# Patient Record
Sex: Male | Born: 1970 | Race: Black or African American | Hispanic: No | Marital: Single | Smoking: Never smoker
Health system: Southern US, Community
[De-identification: ages and names within clinical notes are randomized; demographics above are authoritative.]

## PROBLEM LIST (undated history)

## (undated) DIAGNOSIS — R944 Abnormal results of kidney function studies: Secondary | ICD-10-CM

## (undated) DIAGNOSIS — I10 Essential (primary) hypertension: Secondary | ICD-10-CM

---

## 2018-09-24 DIAGNOSIS — E786 Lipoprotein deficiency: Secondary | ICD-10-CM | POA: Insufficient documentation

## 2018-09-24 DIAGNOSIS — J301 Allergic rhinitis due to pollen: Secondary | ICD-10-CM | POA: Insufficient documentation

## 2020-08-06 ENCOUNTER — Ambulatory Visit: Payer: Self-pay | Attending: Internal Medicine

## 2020-08-06 DIAGNOSIS — Z20822 Contact with and (suspected) exposure to covid-19: Secondary | ICD-10-CM

## 2020-08-07 LAB — NOVEL CORONAVIRUS, NAA: SARS-CoV-2, NAA: NOT DETECTED

## 2020-08-07 LAB — SARS-COV-2, NAA 2 DAY TAT

## 2020-08-20 ENCOUNTER — Emergency Department (HOSPITAL_COMMUNITY)
Admission: EM | Admit: 2020-08-20 | Discharge: 2020-08-20 | Disposition: A | Discharge status: Home / Self Care | Payer: 59 | Attending: Emergency Medicine | Admitting: Emergency Medicine

## 2020-08-20 ENCOUNTER — Emergency Department (HOSPITAL_COMMUNITY): Payer: 59

## 2020-08-20 ENCOUNTER — Encounter (HOSPITAL_COMMUNITY): Payer: Self-pay | Admitting: Emergency Medicine

## 2020-08-20 ENCOUNTER — Other Ambulatory Visit: Payer: Self-pay

## 2020-08-20 DIAGNOSIS — K1120 Sialoadenitis, unspecified: Secondary | ICD-10-CM | POA: Diagnosis not present

## 2020-08-20 DIAGNOSIS — R221 Localized swelling, mass and lump, neck: Secondary | ICD-10-CM | POA: Diagnosis present

## 2020-08-20 LAB — COMPREHENSIVE METABOLIC PANEL
ALT: 29 U/L (ref 0–44)
AST: 31 U/L (ref 15–41)
Albumin: 4 g/dL (ref 3.5–5.0)
Alkaline Phosphatase: 56 U/L (ref 38–126)
Anion gap: 7 (ref 5–15)
BUN: 12 mg/dL (ref 6–20)
CO2: 30 mmol/L (ref 22–32)
Calcium: 9.4 mg/dL (ref 8.9–10.3)
Chloride: 100 mmol/L (ref 98–111)
Creatinine, Ser: 1.52 mg/dL — ABNORMAL HIGH (ref 0.61–1.24)
GFR, Estimated: 56 mL/min — ABNORMAL LOW (ref >60)
Glucose, Bld: 108 mg/dL — ABNORMAL HIGH (ref 70–99)
Potassium: 4 mmol/L (ref 3.5–5.1)
Sodium: 137 mmol/L (ref 135–145)
Total Bilirubin: 0.5 mg/dL (ref 0.3–1.2)
Total Protein: 7.2 g/dL (ref 6.5–8.1)

## 2020-08-20 LAB — CBC WITH DIFFERENTIAL/PLATELET
Abs Immature Granulocytes: 0.05 10*3/uL (ref 0.00–0.07)
Basophils Absolute: 0 10*3/uL (ref 0.0–0.1)
Basophils Relative: 0 %
Eosinophils Absolute: 0.4 10*3/uL (ref 0.0–0.5)
Eosinophils Relative: 3 %
HCT: 43.7 % (ref 39.0–52.0)
Hemoglobin: 14.7 g/dL (ref 13.0–17.0)
Immature Granulocytes: 0 %
Lymphocytes Relative: 10 %
Lymphs Abs: 1.4 10*3/uL (ref 0.7–4.0)
MCH: 29.8 pg (ref 26.0–34.0)
MCHC: 33.6 g/dL (ref 30.0–36.0)
MCV: 88.6 fL (ref 80.0–100.0)
Monocytes Absolute: 0.8 10*3/uL (ref 0.1–1.0)
Monocytes Relative: 6 %
Neutro Abs: 11.2 10*3/uL — ABNORMAL HIGH (ref 1.7–7.7)
Neutrophils Relative %: 81 %
Platelets: 291 10*3/uL (ref 150–400)
RBC: 4.93 MIL/uL (ref 4.22–5.81)
RDW: 13.6 % (ref 11.5–15.5)
WBC: 13.8 10*3/uL — ABNORMAL HIGH (ref 4.0–10.5)
nRBC: 0 % (ref 0.0–0.2)

## 2020-08-20 LAB — HIV ANTIBODY (ROUTINE TESTING W REFLEX): HIV Screen 4th Generation wRfx: NONREACTIVE

## 2020-08-20 MED ORDER — CEPHALEXIN 500 MG PO CAPS
500.0000 mg | ORAL_CAPSULE | Freq: Four times a day (QID) | ORAL | 0 refills | Status: DC
Start: 2020-08-20 — End: 2020-11-27

## 2020-08-20 MED ORDER — IOHEXOL 300 MG/ML  SOLN
75.0000 mL | Freq: Once | INTRAMUSCULAR | Status: AC | PRN
  Administered 2020-08-20: 75 mL via INTRAVENOUS

## 2020-08-20 MED ORDER — CEPHALEXIN 250 MG PO CAPS
500.0000 mg | ORAL_CAPSULE | Freq: Once | ORAL | Status: AC
  Administered 2020-08-20: 500 mg via ORAL
  Filled 2020-08-20: qty 2

## 2020-08-20 NOTE — ED Notes (Signed)
All appropriate discharge materials reviewed at length with patient. Time for questions provided. Pt has no other questions at this time and verbalizes understanding of all provided materials.  

## 2020-08-20 NOTE — ED Provider Notes (Signed)
[facility] EMERGENCY DEPARTMENT Provider Note   CSN: [id number] Arrival date & time: 08/20/20  0008     History Chief Complaint  Patient presents with  . Facial Swelling    Thomas Mcgee is a 50 y.o. male.  HPI     This a 50 year old male with no reported past medical history who presents with neck swelling.  Patient reports this morning he noted slight swelling of the right side of his neck.  He had progressive worsening swelling and pain.  He states he had difficulty turning his neck to the right and opening his mouth.  He has pain with swallowing.  He took some medication at home and has had some improvement.  He denies any dental pain or recent dental procedures.  No history of similar symptoms in the past.  Denies fevers.  History reviewed. No pertinent past medical history.  There are no problems to display for this patient.   History reviewed. No pertinent surgical history.     History reviewed. No pertinent family history.     Home Medications Prior to Admission medications   Medication Sig Start Date End Date Taking? Authorizing Provider  cephALEXin (KEFLEX) 500 MG capsule Take 1 capsule (500 mg total) by mouth 4 (four) times daily. 08/20/20  Yes Rees Matura, Mayer Masker, MD    Allergies    Patient has no known allergies.  Review of Systems   Review of Systems  Constitutional: Negative for fever.  HENT: Positive for facial swelling. Negative for dental problem, drooling, trouble swallowing and voice change.   Respiratory: Negative for shortness of breath.   Cardiovascular: Negative for chest pain.  Genitourinary: Negative for dysuria.  All other systems reviewed and are negative.   Physical Exam Updated Vital Signs BP 106/84 (BP Location: Right Arm)   Pulse 71   Temp 98.8 F (37.1 C) (Oral)   Resp 15   Ht 1.676 m (5\' 6" )   Wt 79.4 kg   SpO2 94%   BMI 28.25 kg/m   Physical Exam Vitals and nursing note reviewed.   Constitutional:      Appearance: He is well-developed. He is not ill-appearing.  HENT:     Head: Normocephalic and atraumatic.     Nose: Nose normal.     Mouth/Throat:     Comments: No fullness noted of the tongue, no trismus, oropharynx clear Eyes:     Pupils: Pupils are equal, round, and reactive to light.  Neck:     Comments: Submandibular swelling to the right with tenderness to palpation, no overlying skin changes Cardiovascular:     Rate and Rhythm: Normal rate and regular rhythm.     Heart sounds: Normal heart sounds. No murmur heard.   Pulmonary:     Effort: Pulmonary effort is normal. No respiratory distress.     Breath sounds: Normal breath sounds. No wheezing.  Abdominal:     Palpations: Abdomen is soft.     Tenderness: There is no abdominal tenderness. There is no rebound.  Musculoskeletal:     Cervical back: Neck supple.     Right lower leg: No edema.     Left lower leg: No edema.  Skin:    General: Skin is warm and dry.  Neurological:     Mental Status: He is alert and oriented to person, place, and time.  Psychiatric:        Mood and Affect: Mood normal.     ED Results / Procedures / Treatments  Labs (all labs ordered are listed, but only abnormal results are displayed) Labs Reviewed  CBC WITH DIFFERENTIAL/PLATELET - Abnormal; Notable for the following components:      Result Value   WBC 13.8 (*)    Neutro Abs 11.2 (*)    All other components within normal limits  COMPREHENSIVE METABOLIC PANEL - Abnormal; Notable for the following components:   Glucose, Bld 108 (*)    Creatinine, Ser 1.52 (*)    GFR, Estimated 56 (*)    All other components within normal limits  MUMPS ANTIBODY, IGG  MUMPS ANTIBODY, IGM  HIV ANTIBODY (ROUTINE TESTING W REFLEX)    EKG None  Radiology CT Soft Tissue Neck W Contrast  Result Date: 08/20/2020 CLINICAL DATA:  50 year old male with right neck and jaw swelling and pain. EXAM: CT NECK WITH CONTRAST TECHNIQUE:  Multidetector CT imaging of the neck was performed using the standard protocol following the bolus administration of intravenous contrast. CONTRAST:  61mL OMNIPAQUE IOHEXOL 300 MG/ML  SOLN COMPARISON:  None. FINDINGS: Pharynx and larynx: Glottic and subglottic larynx are within normal limits. There is mild generalized pharyngeal mucosal space edema, and edema tracking along the right lateral wall of the pharynx which appears secondary to the right submandibular space abnormality. Inflammation in the right parapharyngeal space. Retropharyngeal and left parapharyngeal space remain within normal limits. No discrete pharyngeal or laryngeal mass. Scattered postinflammatory calcifications of the palatine tonsils. Salivary glands: Abnormally enlarged and heterogeneously enhancing right submandibular gland (coronal image 55) with inflammation in and around the right submandibular space. Thickening of the overlying platysma and small volume fluid/soft tissue edema tracking to the submental region from the right. No organized or rim enhancing fluid collection. No soft tissue gas. Ductal ectasia within the right gland, but no sialolithiasis or obstructing etiology identified. The anterior and central sublingual space remain within normal limits. Left submandibular gland and space are within normal limits. Mild left S mg ductal ectasia also. Parotid glands are within normal limits. Thyroid: Negative. Lymph nodes: Reactive appearing level 1 a and right level 1 B lymph nodes. Similar reactive appearing level 2 nodes greater on the right. No cystic or necrotic nodes. Other cervical nodal stations are within normal limits. Vascular: Major vascular structures in the neck and at the skull base including the right IJ appear patent. Dominant left and diminutive right vertebral arteries (normal variant). Limited intracranial: Negative. Visualized orbits: Negative. Mastoids and visualized paranasal sinuses: Mild to moderate bilateral  paranasal sinus mucosal thickening. All sinuses affected. No fluid levels. Tympanic cavities and mastoids are clear. Skeleton: No carious dentition identified but there is lucency about the right mandible wisdom tooth. However, no subperiosteal inflammation identified - and this is felt unrelated to the above submandibular space process. Mild degenerative endplate spurring in the cervical spine at C3-C4. No acute osseous abnormality identified. Upper chest: Negative. IMPRESSION: 1. Multi-spatial right neck edema appears secondary to Acute Sialadenitis of the Right Submandibular Gland. No obstructing lesion identified, therefore favor infectious etiology (viral, bacterial). No abscess or other complicating features. 2. Mild to moderate bilateral paranasal sinus inflammation. Electronically Signed   By: Odessa Fleming M.D.   On: 08/20/2020 05:08    Procedures Procedures   Medications Ordered in ED Medications  cephALEXin (KEFLEX) capsule 500 mg (has no administration in time range)  iohexol (OMNIPAQUE) 300 MG/ML solution 75 mL (75 mLs Intravenous Contrast Given 08/20/20 0438)    ED Course  I have reviewed the triage vital signs and the nursing notes.  Pertinent labs & imaging results that were available during my care of the patient were reviewed by me and considered in my medical decision making (see chart for details).    MDM Rules/Calculators/A&P                           Patient presents with swelling of the neck.  He is nontoxic and vital signs are reassuring.  ABCs are intact and his airway is patent.  He has swelling over the submandibular region on the right.  There are no overlying skin changes.  Consistent with sialadenitis.  Question stone although none are visible on exam.  He has not had any recent dental work and have lower suspicion for Ludwig's.  Abscess is also consideration.  Labs obtained.  He has a leukocytosis to 13 with a left shift.  He is afebrile.  CT scan shows multispecialty  right neck edema secondary to acute sialadenitis of the submandibular gland.  No stone noted.  Infectious etiology favored.  On recheck, patient denies any concerns for HIV.  No recent fevers or viral symptoms.  HIV and mumps testing were sent although mumps tends to have a propensity for the parotid gland.  Will cover for bacterial etiology with Keflex.  Patient was given ENT follow-up.  After history, exam, and medical workup I feel the patient has been appropriately medically screened and is safe for discharge home. Pertinent diagnoses were discussed with the patient. Patient was given return precautions.   Final Clinical Impression(s) / ED Diagnoses Final diagnoses:  Sialoadenitis of submandibular gland    Rx / DC Orders ED Discharge Orders         Ordered    cephALEXin (KEFLEX) 500 MG capsule  4 times daily        08/20/20 0540           Shalamar Plourde, Mayer Masker, MD 08/20/20 [phone]

## 2020-08-20 NOTE — Discharge Instructions (Addendum)
You were seen today for swelling in your neck.  Your CT scan shows evidence of inflammation and infection of your salivary gland.  This could be bacterial or viral.  HIV and mumps testing is pending.  You will be sent home with antibiotics.  You can suck on sour candy to try to milk the duct.  If you develop worsening swelling or difficulty swallowing, you should be reevaluated immediately.

## 2020-08-20 NOTE — ED Provider Notes (Signed)
  Emergency Medicine Provider in Triage Note   MSE was initiated and I personally evaluated the patient  1:12 AM on August 20, 2020 as provider in triage.   Chief Complaint: neck/jaw swelling  HPI  Patient is a 50 y.o. who presets to the ED with complaints of right neck/jaw swelling. Patient states this AM he noted some mild swelling/knot area to just below the jaw line, however tonight around 9PM this area seemed significantly worse, much more swollen & painful. Worse with attempts to open his mouth. Discomfort with swallowing. Denies trauma.    Review of Systems  Positive: Neck/jaw pain & swelling, pain with swallowing Negative: Dyspnea, vomiting  Physical Exam  BP (!) 148/108 (BP Location: Left Arm)   Pulse 89   Temp 98.8 F (37.1 C) (Oral)   Resp 17   Ht 5\' 6"  (1.676 m)   Wt 79.4 kg   SpO2 100%   BMI 28.25 kg/m    Gen:   Awake, no distress   HEENT:  Asymmetric swelling to the right anterior neck, submandibular space however no swelling/tenderness beneath the tongue. Right anterior superior neck tender to palpation. Able to open mouth 2 finger widths- limited assessment of posterior oropharynx, tolerating own secretions without difficulty.  Resp:  Normal effort, no stridor Cardiac:  Normal rate  Abd:   Nondistended, nontender  MSK:   Moves extremities without difficulty  Neuro:  Speech clear   Medical Decision Making   Initiation of care has begun. The patient has been counseled on the process, plan, and necessity for staying for the completion/evaluation, informed that the remainder of the evaluation will be completed by another provider, this initial triage assessment does not replace that evaluation, and the importance of remaining in the ED until their evaluation is complete.  CBC, CMP, CT soft tissue neck.  Given his degree of swelling and location in submandibular space with reported rapid progression nursing staff informed patient needs room.      08/20/20 08/22/20    [id number], MD 08/20/20 [phone]

## 2020-08-20 NOTE — ED Triage Notes (Signed)
Pt reports swelling that had gone from just under his jaw up to his ear.

## 2020-08-21 LAB — MUMPS ANTIBODY, IGG: Mumps IgG: 102 AU/mL (ref >10.9)

## 2020-08-22 LAB — MUMPS ANTIBODY, IGM: Mumps IgM: <0.8 AU (ref 0.00–0.79)

## 2020-11-27 ENCOUNTER — Encounter: Payer: Self-pay | Admitting: Internal Medicine

## 2020-11-27 ENCOUNTER — Other Ambulatory Visit: Payer: Self-pay

## 2020-11-27 ENCOUNTER — Ambulatory Visit (INDEPENDENT_AMBULATORY_CARE_PROVIDER_SITE_OTHER): Payer: No Typology Code available for payment source | Admitting: Internal Medicine

## 2020-11-27 DIAGNOSIS — R682 Dry mouth, unspecified: Secondary | ICD-10-CM | POA: Insufficient documentation

## 2020-11-27 DIAGNOSIS — I10 Essential (primary) hypertension: Secondary | ICD-10-CM

## 2020-11-27 DIAGNOSIS — M79672 Pain in left foot: Secondary | ICD-10-CM | POA: Diagnosis not present

## 2020-11-27 DIAGNOSIS — R739 Hyperglycemia, unspecified: Secondary | ICD-10-CM | POA: Diagnosis not present

## 2020-11-27 DIAGNOSIS — Z0000 Encounter for general adult medical examination without abnormal findings: Secondary | ICD-10-CM | POA: Insufficient documentation

## 2020-11-27 DIAGNOSIS — M79671 Pain in right foot: Secondary | ICD-10-CM

## 2020-11-27 LAB — LIPID PANEL
Cholesterol: 152 mg/dL (ref 0–200)
HDL: 31.7 mg/dL — ABNORMAL LOW (ref >39.00)
LDL Cholesterol: 102 mg/dL — ABNORMAL HIGH (ref 0–99)
NonHDL: 120.63
Total CHOL/HDL Ratio: 5
Triglycerides: 94 mg/dL (ref 0.0–149.0)
VLDL: 18.8 mg/dL (ref 0.0–40.0)

## 2020-11-27 LAB — HEPATIC FUNCTION PANEL
ALT: 13 U/L (ref 0–53)
AST: 17 U/L (ref 0–37)
Albumin: 4.4 g/dL (ref 3.5–5.2)
Alkaline Phosphatase: 55 U/L (ref 39–117)
Bilirubin, Direct: 0.1 mg/dL (ref 0.0–0.3)
Total Bilirubin: 0.7 mg/dL (ref 0.2–1.2)
Total Protein: 7.2 g/dL (ref 6.0–8.3)

## 2020-11-27 LAB — CBC WITH DIFFERENTIAL/PLATELET
Basophils Absolute: 0.1 10*3/uL (ref 0.0–0.1)
Basophils Relative: 1 % (ref 0.0–3.0)
Eosinophils Absolute: 0.3 10*3/uL (ref 0.0–0.7)
Eosinophils Relative: 6.5 % — ABNORMAL HIGH (ref 0.0–5.0)
HCT: 44 % (ref 39.0–52.0)
Hemoglobin: 14.6 g/dL (ref 13.0–17.0)
Lymphocytes Relative: 40.5 % (ref 12.0–46.0)
Lymphs Abs: 2.1 10*3/uL (ref 0.7–4.0)
MCHC: 33.2 g/dL (ref 30.0–36.0)
MCV: 87.4 fl (ref 78.0–100.0)
Monocytes Absolute: 0.4 10*3/uL (ref 0.1–1.0)
Monocytes Relative: 6.9 % (ref 3.0–12.0)
Neutro Abs: 2.3 10*3/uL (ref 1.4–7.7)
Neutrophils Relative %: 45.1 % (ref 43.0–77.0)
Platelets: 226 10*3/uL (ref 150.0–400.0)
RBC: 5.04 Mil/uL (ref 4.22–5.81)
RDW: 13.7 % (ref 11.5–15.5)
WBC: 5.1 10*3/uL (ref 4.0–10.5)

## 2020-11-27 LAB — URINALYSIS
Bilirubin Urine: NEGATIVE
Hgb urine dipstick: NEGATIVE
Ketones, ur: NEGATIVE
Leukocytes,Ua: NEGATIVE
Nitrite: NEGATIVE
Specific Gravity, Urine: 1.015 (ref 1.000–1.030)
Total Protein, Urine: NEGATIVE
Urine Glucose: NEGATIVE
Urobilinogen, UA: 0.2 (ref 0.0–1.0)
pH: 6 (ref 5.0–8.0)

## 2020-11-27 LAB — COMPREHENSIVE METABOLIC PANEL
ALT: 13 U/L (ref 0–53)
AST: 17 U/L (ref 0–37)
Albumin: 4.4 g/dL (ref 3.5–5.2)
Alkaline Phosphatase: 55 U/L (ref 39–117)
BUN: 15 mg/dL (ref 6–23)
CO2: 28 mEq/L (ref 19–32)
Calcium: 9.6 mg/dL (ref 8.4–10.5)
Chloride: 104 mEq/L (ref 96–112)
Creatinine, Ser: 1.32 mg/dL (ref 0.40–1.50)
GFR: 63.37 mL/min (ref >60.00)
Glucose, Bld: 90 mg/dL (ref 70–99)
Potassium: 4.1 mEq/L (ref 3.5–5.1)
Sodium: 140 mEq/L (ref 135–145)
Total Bilirubin: 0.7 mg/dL (ref 0.2–1.2)
Total Protein: 7.2 g/dL (ref 6.0–8.3)

## 2020-11-27 LAB — VITAMIN D 25 HYDROXY (VIT D DEFICIENCY, FRACTURES): VITD: 49.53 ng/mL (ref 30.00–100.00)

## 2020-11-27 LAB — VITAMIN B12: Vitamin B-12: 339 pg/mL (ref 211–911)

## 2020-11-27 LAB — HEMOGLOBIN A1C: Hgb A1c MFr Bld: 5.9 % (ref 4.6–6.5)

## 2020-11-27 LAB — PSA: PSA: 1.22 ng/mL (ref 0.10–4.00)

## 2020-11-27 NOTE — Progress Notes (Signed)
Subjective:  Patient ID: Thomas Mcgee, male    DOB: 08-30-1970  Age: 50 y.o. MRN: [id number]  CC: New Patient (Initial Visit)   HPI Thomas Mcgee presents for dry mouth, heel pain on the L side, complaining of urinating a lot. We need to address his HTN -he has been on Amlodipine 12 months He is from [place], 2 kids  Outpatient Medications Prior to Visit  Medication Sig Dispense Refill   amLODipine (NORVASC) 10 MG tablet Take 10 mg by mouth daily. Take 1 by mouth daily (Patient not taking: Reported on 11/27/2020)     cephALEXin (KEFLEX) 500 MG capsule Take 1 capsule (500 mg total) by mouth 4 (four) times daily. (Patient not taking: Reported on 11/27/2020) 28 capsule 0   No facility-administered medications prior to visit.    ROS: Review of Systems  Constitutional:  Positive for unexpected weight change. Negative for appetite change and fatigue.  HENT:  Negative for congestion, nosebleeds, sneezing, sore throat and trouble swallowing.   Eyes:  Negative for itching and visual disturbance.  Respiratory:  Negative for cough.   Cardiovascular:  Negative for chest pain, palpitations and leg swelling.  Gastrointestinal:  Negative for abdominal distention, blood in stool, diarrhea and nausea.  Genitourinary:  Negative for frequency and hematuria.  Musculoskeletal:  Positive for gait problem. Negative for back pain, joint swelling and neck pain.  Skin:  Negative for rash.  Neurological:  Negative for dizziness, tremors, speech difficulty and weakness.  Psychiatric/Behavioral:  Negative for agitation, dysphoric mood and sleep disturbance. The patient is not nervous/anxious.    Objective:  BP (!) 130/92 (BP Location: Left Arm)   Pulse 80   Temp 98.5 F (36.9 C) (Oral)   Ht 5' 5.5" (1.664 m)   Wt 185 lb 9.6 oz (84.2 kg)   SpO2 95%   BMI 30.42 kg/m   BP Readings from Last 3 Encounters:  11/27/20 (!) 130/92  08/20/20 106/84    Wt Readings from Last 3 Encounters:  11/27/20 185 lb  9.6 oz (84.2 kg)  08/20/20 175 lb (79.4 kg)    Physical Exam Constitutional:      General: He is not in acute distress.    Appearance: He is well-developed.     Comments: NAD  Eyes:     Conjunctiva/sclera: Conjunctivae normal.     Pupils: Pupils are equal, round, and reactive to light.  Neck:     Thyroid: No thyromegaly.     Vascular: No JVD.  Cardiovascular:     Rate and Rhythm: Normal rate and regular rhythm.     Heart sounds: Normal heart sounds. No murmur heard.   No friction rub. No gallop.  Pulmonary:     Effort: Pulmonary effort is normal. No respiratory distress.     Breath sounds: Normal breath sounds. No wheezing or rales.  Chest:     Chest wall: No tenderness.  Abdominal:     General: Bowel sounds are normal. There is no distension.     Palpations: Abdomen is soft. There is no mass.     Tenderness: There is no abdominal tenderness. There is no guarding or rebound.  Musculoskeletal:        General: No tenderness. Normal range of motion.     Cervical back: Normal range of motion.  Lymphadenopathy:     Cervical: No cervical adenopathy.  Skin:    General: Skin is warm and dry.     Findings: No rash.  Neurological:     Mental  Status: He is alert and oriented to person, place, and time.     Cranial Nerves: No cranial nerve deficit.     Motor: No abnormal muscle tone.     Coordination: Coordination normal.     Gait: Gait normal.     Deep Tendon Reflexes: Reflexes are normal and symmetric.  Psychiatric:        Behavior: Behavior normal.        Thought Content: Thought content normal.        Judgment: Judgment normal.   L foot oval growth, heel pain   Lab Results  Component Value Date   WBC 13.8 (H) 08/20/2020   HGB 14.7 08/20/2020   HCT 43.7 08/20/2020   PLT 291 08/20/2020   GLUCOSE 108 (H) 08/20/2020   ALT 29 08/20/2020   AST 31 08/20/2020   NA 137 08/20/2020   K 4.0 08/20/2020   CL 100 08/20/2020   CREATININE 1.52 (H) 08/20/2020   BUN 12 08/20/2020    CO2 30 08/20/2020    CT Soft Tissue Neck W Contrast  Result Date: 08/20/2020 CLINICAL DATA:  50 year old male with right neck and jaw swelling and pain. EXAM: CT NECK WITH CONTRAST TECHNIQUE: Multidetector CT imaging of the neck was performed using the standard protocol following the bolus administration of intravenous contrast. CONTRAST:  65mL OMNIPAQUE IOHEXOL 300 MG/ML  SOLN COMPARISON:  None. FINDINGS: Pharynx and larynx: Glottic and subglottic larynx are within normal limits. There is mild generalized pharyngeal mucosal space edema, and edema tracking along the right lateral wall of the pharynx which appears secondary to the right submandibular space abnormality. Inflammation in the right parapharyngeal space. Retropharyngeal and left parapharyngeal space remain within normal limits. No discrete pharyngeal or laryngeal mass. Scattered postinflammatory calcifications of the palatine tonsils. Salivary glands: Abnormally enlarged and heterogeneously enhancing right submandibular gland (coronal image 55) with inflammation in and around the right submandibular space. Thickening of the overlying platysma and small volume fluid/soft tissue edema tracking to the submental region from the right. No organized or rim enhancing fluid collection. No soft tissue gas. Ductal ectasia within the right gland, but no sialolithiasis or obstructing etiology identified. The anterior and central sublingual space remain within normal limits. Left submandibular gland and space are within normal limits. Mild left S mg ductal ectasia also. Parotid glands are within normal limits. Thyroid: Negative. Lymph nodes: Reactive appearing level 1 a and right level 1 B lymph nodes. Similar reactive appearing level 2 nodes greater on the right. No cystic or necrotic nodes. Other cervical nodal stations are within normal limits. Vascular: Major vascular structures in the neck and at the skull base including the right IJ appear patent.  Dominant left and diminutive right vertebral arteries (normal variant). Limited intracranial: Negative. Visualized orbits: Negative. Mastoids and visualized paranasal sinuses: Mild to moderate bilateral paranasal sinus mucosal thickening. All sinuses affected. No fluid levels. Tympanic cavities and mastoids are clear. Skeleton: No carious dentition identified but there is lucency about the right mandible wisdom tooth. However, no subperiosteal inflammation identified - and this is felt unrelated to the above submandibular space process. Mild degenerative endplate spurring in the cervical spine at C3-C4. No acute osseous abnormality identified. Upper chest: Negative. IMPRESSION: 1. Multi-spatial right neck edema appears secondary to Acute Sialadenitis of the Right Submandibular Gland. No obstructing lesion identified, therefore favor infectious etiology (viral, bacterial). No abscess or other complicating features. 2. Mild to moderate bilateral paranasal sinus inflammation. Electronically Signed   By: Althea Grimmer.D.  On: 08/20/2020 05:08    Assessment & Plan:   There are no diagnoses linked to this encounter.   No orders of the defined types were placed in this encounter.    Follow-up: No follow-ups on file.  Sonda Primes, MD

## 2020-11-27 NOTE — Patient Instructions (Addendum)
[place] for home [employer]: [employer] Plantar Fasciitis ShoesPlantar Fasciitis  Plantar fasciitis is a painful foot condition that affects the heel. It occurs when the band of tissue that connects the toes to the heel bone (plantar fascia) becomes irritated. This can happen as the result of exercising too much or doing other repetitive activities (overuse injury). Plantar fasciitis can cause mild irritation to severe pain that makes it difficult to walk or move. The pain is usually worse in the morning after sleeping, or after sitting or lying down for a period of time. Pain may also beworse after long periods of walking or standing. What are the causes? This condition may be caused by: Standing for long periods of time. Wearing shoes that do not have good arch support. Doing activities that put stress on joints (high-impact activities). This includes ballet and exercise that makes your heart beat faster (aerobic exercise), such as running. Being overweight. An abnormal way of walking (gait). Tight muscles in the back of your lower leg (calf). High arches in your feet or flat feet. Starting a new athletic activity. What are the signs or symptoms? The main symptom of this condition is heel pain. Pain may get worse after the following: Taking the first steps after a time of rest, especially in the morning after awakening, or after you have been sitting or lying down for a while. Long periods of standing still. Pain may decrease after 30-45 minutes of activity, such as gentle walking. How is this diagnosed? This condition may be diagnosed based on your medical history, a physical exam, and your symptoms. Your health care provider will check for: A tender area on the bottom of your foot. A high arch in your foot or flat feet. Pain when you move your foot. Difficulty moving your foot. You may have imaging tests to confirm the diagnosis, such as: X-rays. Ultrasound. MRI. How is this  treated? Treatment for plantar fasciitis depends on how severe your condition is. Treatment may include: Rest, ice, pressure (compression), and raising (elevating) the affected foot. This is called RICE therapy. Your health care provider may recommend RICE therapy along with over-the-counter pain medicines to manage your pain. Exercises to stretch your calves and your plantar fascia. A splint that holds your foot in a stretched, upward position while you sleep (night splint). Physical therapy to relieve symptoms and prevent problems in the future. Injections of steroid medicine (cortisone) to relieve pain and inflammation. Stimulating your plantar fascia with electrical impulses (extracorporeal shock wave therapy). This is usually the last treatment option before surgery. Surgery, if other treatments have not worked after 12 months. Follow these instructions at home: Managing pain, stiffness, and swelling  If directed, put ice on the painful area. To do this: Put ice in a plastic bag, or use a frozen bottle of water. Place a towel between your skin and the bag or bottle. Roll the bottom of your foot over the bag or bottle. Do this for 20 minutes, 2-3 times a day. Wear athletic shoes that have air-sole or gel-sole cushions, or try soft shoe inserts that are designed for plantar fasciitis. Elevate your foot above the level of your heart while you are sitting or lying down.  Activity Avoid activities that cause pain. Ask your health care provider what activities are safe for you. Do physical therapy exercises and stretches as told by your health care provider. Try activities and forms of exercise that are easier on your joints (low impact). Examples include swimming,  water aerobics, and biking. General instructions Take over-the-counter and prescription medicines only as told by your health care provider. Wear a night splint while sleeping, if told by your health care provider. Loosen the  splint if your toes tingle, become numb, or turn cold and blue. Maintain a healthy weight, or work with your health care provider to lose weight as needed. Keep all follow-up visits. This is important. Contact a health care provider if you have: Symptoms that do not go away with home treatment. Pain that gets worse. Pain that affects your ability to move or do daily activities. Summary Plantar fasciitis is a painful foot condition that affects the heel. It occurs when the band of tissue that connects the toes to the heel bone (plantar fascia) becomes irritated. Heel pain is the main symptom of this condition. It may get worse after exercising too much or standing still for a long time. Treatment varies, but it usually starts with rest, ice, pressure (compression), and raising (elevating) the affected foot. This is called RICE therapy. Over-the-counter medicines can also be used to manage pain. This information is not intended to replace advice given to you by your health care provider. Make sure you discuss any questions you have with your healthcare provider. Document Revised: 07/29/2019 Document Reviewed: 07/29/2019 Elsevier Patient Education  2022 [employer].

## 2020-11-29 DIAGNOSIS — I10 Essential (primary) hypertension: Secondary | ICD-10-CM | POA: Insufficient documentation

## 2020-11-29 NOTE — Assessment & Plan Note (Signed)
The patient has a painful left foot oval painful growth.  He has bilateral plantar fasciitis left greater than the right.  He was referred to Sports medicine for consultation.  He will use ibuprofen 600 mg to 3 times a day with meals. She will get shoes with good arch support like [employer]et

## 2020-11-29 NOTE — Assessment & Plan Note (Signed)
Currently on amlodipine 10 mg daily.  She denies feet swelling.  We will continue with current therapy.  Will obtain blood chemistry, thyroid tests, vitamin levels

## 2020-11-29 NOTE — Assessment & Plan Note (Signed)
Obtain hemoglobin A1c 

## 2020-11-29 NOTE — Assessment & Plan Note (Signed)
Unclear etiology.  She has a history of elevated glucose.  She has put 10 pounds on.  Rule out diabetes.  Obtain glucose level and hemoglobin A1c

## 2020-11-30 LAB — TSH: TSH: 0 u[IU]/mL — ABNORMAL LOW (ref 0.35–5.50)

## 2020-12-02 ENCOUNTER — Other Ambulatory Visit: Payer: Self-pay | Admitting: Internal Medicine

## 2020-12-02 DIAGNOSIS — R7989 Other specified abnormal findings of blood chemistry: Secondary | ICD-10-CM

## 2020-12-03 ENCOUNTER — Ambulatory Visit: Payer: No Typology Code available for payment source | Admitting: Allergy

## 2020-12-09 ENCOUNTER — Encounter: Payer: Self-pay | Admitting: Internal Medicine

## 2020-12-09 ENCOUNTER — Other Ambulatory Visit: Payer: Self-pay

## 2020-12-09 ENCOUNTER — Ambulatory Visit (INDEPENDENT_AMBULATORY_CARE_PROVIDER_SITE_OTHER): Payer: 59 | Admitting: Internal Medicine

## 2020-12-09 DIAGNOSIS — R7989 Other specified abnormal findings of blood chemistry: Secondary | ICD-10-CM

## 2020-12-09 DIAGNOSIS — Z7184 Encounter for health counseling related to travel: Secondary | ICD-10-CM | POA: Diagnosis not present

## 2020-12-09 DIAGNOSIS — R682 Dry mouth, unspecified: Secondary | ICD-10-CM

## 2020-12-09 DIAGNOSIS — M79671 Pain in right foot: Secondary | ICD-10-CM

## 2020-12-09 DIAGNOSIS — I10 Essential (primary) hypertension: Secondary | ICD-10-CM

## 2020-12-09 DIAGNOSIS — M79672 Pain in left foot: Secondary | ICD-10-CM

## 2020-12-09 MED ORDER — ATOVAQUONE-PROGUANIL HCL 250-100 MG PO TABS: ORAL_TABLET | ORAL | 1 refills | Status: DC

## 2020-12-09 NOTE — Progress Notes (Signed)
Subjective:  Patient ID: Thomas Mcgee, male    DOB: 1971/01/14  Age: 50 y.o. 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 Will be leaving next week) and Colonoscopy (Discuss if he need to have)   HPI Thomas Mcgee presents for abn his TSH He is traveling to [place] for 2 weeks Is pain in the feet is a little better.  Outpatient Medications Prior to Visit  Medication Sig Dispense Refill   amLODipine (NORVASC) 10 MG tablet Take 10 mg by mouth daily. Take 1 by mouth daily (Patient not taking: Reported on 11/27/2020)     No facility-administered medications prior to visit.    ROS: Review of Systems  Constitutional:  Negative for appetite change, fatigue and unexpected weight change.  HENT:  Negative for congestion, nosebleeds, sneezing, sore throat and trouble swallowing.   Eyes:  Negative for itching and visual disturbance.  Respiratory:  Negative for cough.   Cardiovascular:  Negative for chest pain, palpitations and leg swelling.  Gastrointestinal:  Negative for abdominal distention, blood in stool, diarrhea and nausea.  Genitourinary:  Negative for frequency and hematuria.  Musculoskeletal:  Negative for back pain, gait problem, joint swelling and neck pain.  Skin:  Negative for rash.  Neurological:  Negative for dizziness, tremors, speech difficulty and weakness.  Psychiatric/Behavioral:  Negative for agitation, dysphoric mood and sleep disturbance. The patient is not nervous/anxious.    Objective:  BP 132/90 (BP Location: Left Arm)   Pulse 75   Temp 98.3 F (36.8 C) (Oral)   SpO2 98%   BP Readings from Last 3 Encounters:  12/09/20 132/90  11/27/20 (!) 130/92  08/20/20 106/84    Wt Readings from Last 3 Encounters:  11/27/20 185 lb 9.6 oz (84.2 kg)  08/20/20 175 lb (79.4 kg)    Physical Exam Constitutional:      General: He is not in acute distress.    Appearance: He is well-developed.     Comments: NAD   Eyes:     Conjunctiva/sclera: Conjunctivae normal.     Pupils: Pupils are equal, round, and reactive to light.  Neck:     Thyroid: No thyromegaly.     Vascular: No JVD.  Cardiovascular:     Rate and Rhythm: Normal rate and regular rhythm.     Heart sounds: Normal heart sounds. No murmur heard.   No friction rub. No gallop.  Pulmonary:     Effort: Pulmonary effort is normal. No respiratory distress.     Breath sounds: Normal breath sounds. No wheezing or rales.  Chest:     Chest wall: No tenderness.  Abdominal:     General: Bowel sounds are normal. There is no distension.     Palpations: Abdomen is soft. There is no mass.     Tenderness: There is no abdominal tenderness. There is no guarding or rebound.  Musculoskeletal:        General: No tenderness. Normal range of motion.     Cervical back: Normal range of motion.  Lymphadenopathy:     Cervical: No cervical adenopathy.  Skin:    General: Skin is warm and dry.     Findings: No rash.  Neurological:     Mental Status: He is alert and oriented to person, place, and time.     Cranial Nerves: No cranial nerve deficit.     Motor: No abnormal muscle tone.     Coordination: Coordination normal.  Gait: Gait normal.     Deep Tendon Reflexes: Reflexes are normal and symmetric.  Psychiatric:        Behavior: Behavior normal.        Thought Content: Thought content normal.        Judgment: Judgment normal.    Lab Results  Component Value Date   WBC 5.1 11/27/2020   HGB 14.6 11/27/2020   HCT 44.0 11/27/2020   PLT 226.0 11/27/2020   GLUCOSE 90 11/27/2020   CHOL 152 11/27/2020   TRIG 94.0 11/27/2020   HDL 31.70 (L) 11/27/2020   LDLCALC 102 (H) 11/27/2020   ALT 13 11/27/2020   ALT 13 11/27/2020   AST 17 11/27/2020   AST 17 11/27/2020   NA 140 11/27/2020   K 4.1 11/27/2020   CL 104 11/27/2020   CREATININE 1.32 11/27/2020   BUN 15 11/27/2020   CO2 28 11/27/2020   TSH 0.00 Repeated and verified X2. (L) 11/27/2020   PSA  1.22 11/27/2020   HGBA1C 5.9 11/27/2020    CT Soft Tissue Neck W Contrast  Result Date: 08/20/2020 CLINICAL DATA:  50 year old male with right neck and jaw swelling and pain. EXAM: CT NECK WITH CONTRAST TECHNIQUE: Multidetector CT imaging of the neck was performed using the standard protocol following the bolus administration of intravenous contrast. CONTRAST:  19mL OMNIPAQUE IOHEXOL 300 MG/ML  SOLN COMPARISON:  None. FINDINGS: Pharynx and larynx: Glottic and subglottic larynx are within normal limits. There is mild generalized pharyngeal mucosal space edema, and edema tracking along the right lateral wall of the pharynx which appears secondary to the right submandibular space abnormality. Inflammation in the right parapharyngeal space. Retropharyngeal and left parapharyngeal space remain within normal limits. No discrete pharyngeal or laryngeal mass. Scattered postinflammatory calcifications of the palatine tonsils. Salivary glands: Abnormally enlarged and heterogeneously enhancing right submandibular gland (coronal image 55) with inflammation in and around the right submandibular space. Thickening of the overlying platysma and small volume fluid/soft tissue edema tracking to the submental region from the right. No organized or rim enhancing fluid collection. No soft tissue gas. Ductal ectasia within the right gland, but no sialolithiasis or obstructing etiology identified. The anterior and central sublingual space remain within normal limits. Left submandibular gland and space are within normal limits. Mild left S mg ductal ectasia also. Parotid glands are within normal limits. Thyroid: Negative. Lymph nodes: Reactive appearing level 1 a and right level 1 B lymph nodes. Similar reactive appearing level 2 nodes greater on the right. No cystic or necrotic nodes. Other cervical nodal stations are within normal limits. Vascular: Major vascular structures in the neck and at the skull base including the right IJ  appear patent. Dominant left and diminutive right vertebral arteries (normal variant). Limited intracranial: Negative. Visualized orbits: Negative. Mastoids and visualized paranasal sinuses: Mild to moderate bilateral paranasal sinus mucosal thickening. All sinuses affected. No fluid levels. Tympanic cavities and mastoids are clear. Skeleton: No carious dentition identified but there is lucency about the right mandible wisdom tooth. However, no subperiosteal inflammation identified - and this is felt unrelated to the above submandibular space process. Mild degenerative endplate spurring in the cervical spine at C3-C4. No acute osseous abnormality identified. Upper chest: Negative. IMPRESSION: 1. Multi-spatial right neck edema appears secondary to Acute Sialadenitis of the Right Submandibular Gland. No obstructing lesion identified, therefore favor infectious etiology (viral, bacterial). No abscess or other complicating features. 2. Mild to moderate bilateral paranasal sinus inflammation. Electronically Signed   By: Odessa Fleming  M.D.   On: 08/20/2020 05:08    Assessment & Plan:      Sonda Primes, MD

## 2020-12-09 NOTE — Assessment & Plan Note (Addendum)
Take ASA on the days of travel for DVT prophylaxis. Prescribed Malarone for malaria prophylaxis: 1 po qd - start 2 d before travel, take daily while in [place] and x 1 wk after return. Then stop

## 2020-12-09 NOTE — Progress Notes (Signed)
Subjective:    I'm seeing this patient as a consultation for:  Dr. Posey Rea. Note will be routed back to referring provider/PCP.  CC: B foot pain  I, [name], LAT, ATC, am serving as scribe for Dr. Clementeen Graham.  HPI: Pt is a 50 y/o male presenting w/ B plantar foot pain, L>R, x 4-5 months.  He locates his pain specifically to his B plantar heels and L medial arch/plantar fascia just proximal to the L 1st MTP joint.  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  He works in [employer] in [employer].  He lives most the time here in [place] and commutes to work a few days a week.  He will be returning to [place] next week and returning back today and states mid-September.  Additionally he notes a little bit of athlete's foot on his right foot and he uses Lamisil intermittently for this.  It keeps coming back.  Swelling: yes in the L plantar foot just proximal to the L 1st MTP joint Aggravating factors: walking/weight-bearing activity; jogging Treatments tried: topical pain-relieving gel  Past medical history, Surgical history, Family history, Social history, Allergies, and medications have been entered into the medical record, reviewed.   Review of Systems: No new headache, visual changes, nausea, vomiting, diarrhea, constipation, dizziness, abdominal pain, skin rash, fevers, chills, night sweats, weight loss, swollen lymph nodes, body aches, joint swelling, muscle aches, chest pain, shortness of breath, mood changes, visual or auditory hallucinations.   Objective:    Vitals:   12/10/20 0906  BP: 120/84  Pulse: 77  SpO2: 95%   General: Well Developed, well nourished, and in no acute distress.  Neuro/Psych: Alert and oriented x3, extra-ocular muscles intact, able to move all 4 extremities, sensation grossly intact. Skin: Warm and dry.  Whitish area at interdigital webspace between fourth and fifth toe right foot. Respiratory: Not using accessory  muscles, speaking in full sentences, trachea midline.  Cardiovascular: Pulses palpable, no extremity edema. Abdomen: Does not appear distended. MSK: Left foot: Palpable nodule plantar aspect left foot just proximal to first MCP.  Mildly tender palpation.   Lab and Radiology Results Diagnostic Limited MSK Ultrasound of: Left foot plantar nodule first metatarsal Solid and partially cystic nodule visible plantar foot overlying flexor houses longus tendon.  Some hypoechoic cystic structures present intermixed with more solid structures.  No severe significant increased vascular activity. Impression: Heterogeneous see me cystic and solid nodule plantar aspect left foot overlying first metatarsal unclear etiology.  X-ray images left foot obtained today personally and independently interpreted No significant bony abnormality and area of question where the nodule is located plantar aspect foot. No acute fracture.  Question sequelae remote fracture proximal third metatarsal Await formal radiology review   Impression and Recommendations:    Assessment and Plan: 50 y.o. male with left plantar foot nodule with pain.  Patient has a easily palpable superficial nodule on the left plantar foot.  This is effectively causing excessive pressure and pain.  Unfortunately I am not sure exactly what the nodule is.  I think it is probably a lipoma however the characteristics of it on ultrasound are not typical for lipoma.  Soft tissue malignancy is pretty unlikely but a remote possibility.  Plan for MRI left foot with and without contrast to further evaluate the soft tissue mass.  This will also be for potential surgical planning.  I think ultimately this will need to be removed surgically.  For now we will discussed offloading pressure.  I have created a first ray float to offload pressure at the nodule and showed him how to make this for his own shoes using foam adhesive pads.  He notes this feels better  already.  Recheck with me after the MRI is completed.  As for the athlete's foot instructed that he needs to use Lamisil more consistently at least daily or twice daily for up to 3 weeks.  If not better certainly could try oral terbinafine.  However this would require labs first..  PDMP not reviewed this encounter. Orders Placed This Encounter  Procedures   [place] LIMITED JOINT SPACE STRUCTURES LOW BILAT(NO LINKED CHARGES)    Order Specific Question:   Reason for Exam (SYMPTOM  OR DIAGNOSIS REQUIRED)    Answer:   B heel pain    Order Specific Question:   Preferred imaging location?    Answer:   [facility] Foot Complete Left    Standing Status:   Future    Number of Occurrences:   1    Standing Expiration Date:   12/10/2021    Order Specific Question:   Reason for Exam (SYMPTOM  OR DIAGNOSIS REQUIRED)    Answer:   foot mass    Order Specific Question:   Preferred imaging location?    Answer:   [facility]   MR FOOT LEFT W WO CONTRAST    Standing Status:   Future    Standing Expiration Date:   12/10/2021    Order Specific Question:   If indicated for the ordered procedure, I authorize the administration of contrast media per Radiology protocol    Answer:   Yes    Order Specific Question:   What is the patient's sedation requirement?    Answer:   No Sedation    Order Specific Question:   Does the patient have a pacemaker or implanted devices?    Answer:   No    Order Specific Question:   Preferred imaging location?    Answer:   [facility] (table limit-550lbs)   No orders of the defined types were placed in this encounter.   Discussed warning signs or symptoms. Please see discharge instructions. Patient expresses understanding.   The above documentation has been reviewed and is accurate and complete Clementeen Graham, M.D.

## 2020-12-09 NOTE — Assessment & Plan Note (Addendum)
8/22 TSH=0.0 Will get TSH, FT4, ESR, thyr peroxidase Ab Repeat TSH=0.0 with normal free T4 and normal free T3.  Normal sed rate and negative thyroid peroxidase antibody test.  Clinically he is euthyroid.  We will continue to monitor labs 

## 2020-12-10 ENCOUNTER — Ambulatory Visit (INDEPENDENT_AMBULATORY_CARE_PROVIDER_SITE_OTHER): Payer: 59

## 2020-12-10 ENCOUNTER — Ambulatory Visit: Payer: Self-pay

## 2020-12-10 ENCOUNTER — Ambulatory Visit (INDEPENDENT_AMBULATORY_CARE_PROVIDER_SITE_OTHER): Payer: 59 | Admitting: Family Medicine

## 2020-12-10 ENCOUNTER — Encounter: Payer: Self-pay | Admitting: Family Medicine

## 2020-12-10 VITALS — BP 120/84 | HR 77 | Ht 65.5 in | Wt 186.2 lb

## 2020-12-10 DIAGNOSIS — M79671 Pain in right foot: Secondary | ICD-10-CM

## 2020-12-10 DIAGNOSIS — M79672 Pain in left foot: Secondary | ICD-10-CM

## 2020-12-10 DIAGNOSIS — R2242 Localized swelling, mass and lump, left lower limb: Secondary | ICD-10-CM

## 2020-12-10 DIAGNOSIS — B353 Tinea pedis: Secondary | ICD-10-CM

## 2020-12-10 LAB — THYROID PEROXIDASE ANTIBODY: Thyroperoxidase Ab SerPl-aCnc: 2 IU/mL (ref <9)

## 2020-12-10 LAB — T4, FREE: Free T4: 1.29 ng/dL (ref 0.60–1.60)

## 2020-12-10 LAB — TSH: TSH: <0.01 u[IU]/mL — ABNORMAL LOW (ref 0.35–5.50)

## 2020-12-10 LAB — T3, FREE: T3, Free: 3.6 pg/mL (ref 2.3–4.2)

## 2020-12-10 NOTE — Patient Instructions (Addendum)
Thank you for coming in today.   Create a pad in your shoe.  Gregery Na [id number] Self Adhesive Foam Sheets - Pack of 20, Class Pack of Craft Pages for [employer] and [employer], [place] for [employer], [employer] or [employer]!  Please call [facility] at [phone] to schedule your MRI for when you return to the [place].   You need to use the Lamisil for 3 weeks every days for it to work.  If not better we can prescribe a pill for Athletes foot.   Recheck after the MRI.    Please get an Xray today before you leave

## 2020-12-15 MED ORDER — VITAMIN D3 50 MCG (2000 UT) PO CAPS: 2000.0000 [IU] | ORAL_CAPSULE | Freq: Every day | ORAL | 3 refills | Status: DC

## 2020-12-15 NOTE — Assessment & Plan Note (Signed)
Discussed again.  This should get better with more supportive shoes

## 2020-12-15 NOTE — Progress Notes (Signed)
Left foot x-ray no severe arthritis.  No fractures are visible.

## 2020-12-15 NOTE — Assessment & Plan Note (Signed)
We may need to reduce amlodipine to 5 mg daily

## 2020-12-24 ENCOUNTER — Ambulatory Visit: Payer: 59 | Admitting: Internal Medicine

## 2021-02-04 ENCOUNTER — Telehealth: Payer: 59 | Admitting: Internal Medicine

## 2021-02-04 ENCOUNTER — Ambulatory Visit: Payer: 59 | Admitting: Internal Medicine

## 2021-02-11 ENCOUNTER — Ambulatory Visit: Payer: 59 | Admitting: Internal Medicine

## 2021-02-18 ENCOUNTER — Encounter: Payer: Self-pay | Admitting: Internal Medicine

## 2021-02-18 ENCOUNTER — Ambulatory Visit: Payer: No Typology Code available for payment source | Admitting: Allergy

## 2021-02-18 ENCOUNTER — Other Ambulatory Visit: Payer: Self-pay

## 2021-02-18 ENCOUNTER — Ambulatory Visit (INDEPENDENT_AMBULATORY_CARE_PROVIDER_SITE_OTHER): Payer: 59 | Admitting: Internal Medicine

## 2021-02-18 DIAGNOSIS — R7989 Other specified abnormal findings of blood chemistry: Secondary | ICD-10-CM

## 2021-02-18 DIAGNOSIS — Z1211 Encounter for screening for malignant neoplasm of colon: Secondary | ICD-10-CM

## 2021-02-18 DIAGNOSIS — Z1159 Encounter for screening for other viral diseases: Secondary | ICD-10-CM | POA: Diagnosis not present

## 2021-02-18 LAB — COMPREHENSIVE METABOLIC PANEL
ALT: 10 U/L (ref 0–53)
AST: 16 U/L (ref 0–37)
Albumin: 4.6 g/dL (ref 3.5–5.2)
Alkaline Phosphatase: 57 U/L (ref 39–117)
BUN: 13 mg/dL (ref 6–23)
CO2: 31 mEq/L (ref 19–32)
Calcium: 9.5 mg/dL (ref 8.4–10.5)
Chloride: 101 mEq/L (ref 96–112)
Creatinine, Ser: 1.62 mg/dL — ABNORMAL HIGH (ref 0.40–1.50)
GFR: 49.49 mL/min — ABNORMAL LOW (ref >60.00)
Glucose, Bld: 74 mg/dL (ref 70–99)
Potassium: 4.3 mEq/L (ref 3.5–5.1)
Sodium: 138 mEq/L (ref 135–145)
Total Bilirubin: 0.8 mg/dL (ref 0.2–1.2)
Total Protein: 7.5 g/dL (ref 6.0–8.3)

## 2021-02-18 LAB — T3, FREE: T3, Free: 3 pg/mL (ref 2.3–4.2)

## 2021-02-18 LAB — T4, FREE: Free T4: 0.92 ng/dL (ref 0.60–1.60)

## 2021-02-18 LAB — TSH: TSH: 0.64 u[IU]/mL (ref 0.35–5.50)

## 2021-02-18 NOTE — Assessment & Plan Note (Signed)
Hep C test Hep B and A vaccinated

## 2021-02-18 NOTE — Assessment & Plan Note (Signed)
Discussed options. He elected Cologuard

## 2021-02-18 NOTE — Progress Notes (Signed)
Subjective:  Patient ID: Thomas Mcgee, male    DOB: 07-Mar-1971  Age: 50 y.o. MRN: [id number]  CC: Follow-up   HPI Gillie Crisci presents for HTN, nedds Hep C screening, colon Ca screening  Outpatient Medications Prior to Visit  Medication Sig Dispense Refill   amLODipine (NORVASC) 10 MG tablet Take 10 mg by mouth daily. Take 1 by mouth daily (Patient not taking: No sig reported)     atovaquone-proguanil (MALARONE) 250-100 MG TABS tablet 1 po qd - start 2 d before travel, take daily while in [place] and x 1 wk after return. Then stop (Patient not taking: Reported on 02/18/2021) 28 tablet 1   Cholecalciferol (VITAMIN D3) 50 MCG (2000 UT) capsule Take 1 capsule (2,000 Units total) by mouth daily. (Patient not taking: Reported on 02/18/2021) 100 capsule 3   No facility-administered medications prior to visit.    ROS: Review of Systems  Constitutional:  Positive for unexpected weight change. Negative for appetite change and fatigue.  HENT:  Negative for congestion, nosebleeds, sneezing, sore throat and trouble swallowing.   Eyes:  Negative for itching and visual disturbance.  Respiratory:  Negative for cough.   Cardiovascular:  Negative for chest pain, palpitations and leg swelling.  Gastrointestinal:  Negative for abdominal distention, blood in stool, diarrhea and nausea.  Genitourinary:  Negative for frequency and hematuria.  Musculoskeletal:  Negative for back pain, gait problem, joint swelling and neck pain.  Skin:  Negative for rash.  Neurological:  Negative for dizziness, tremors, speech difficulty and weakness.  Psychiatric/Behavioral:  Negative for agitation, dysphoric mood and sleep disturbance. The patient is not nervous/anxious.    Objective:  BP 128/86 (BP Location: Left Arm)   Pulse 94   Temp 98 F (36.7 C) (Oral)   Ht 5' 5.5" (1.664 m)   Wt 179 lb 3.2 oz (81.3 kg)   SpO2 94%   BMI 29.37 kg/m   BP Readings from Last 3 Encounters:  02/18/21 128/86  12/10/20  120/84  12/09/20 132/90    Wt Readings from Last 3 Encounters:  02/18/21 179 lb 3.2 oz (81.3 kg)  12/10/20 186 lb 3.2 oz (84.5 kg)  11/27/20 185 lb 9.6 oz (84.2 kg)    Physical Exam Constitutional:      General: He is not in acute distress.    Appearance: He is well-developed.     Comments: NAD  Eyes:     Conjunctiva/sclera: Conjunctivae normal.     Pupils: Pupils are equal, round, and reactive to light.  Neck:     Thyroid: No thyromegaly.     Vascular: No JVD.  Cardiovascular:     Rate and Rhythm: Normal rate and regular rhythm.     Heart sounds: Normal heart sounds. No murmur heard.   No friction rub. No gallop.  Pulmonary:     Effort: Pulmonary effort is normal. No respiratory distress.     Breath sounds: Normal breath sounds. No wheezing or rales.  Chest:     Chest wall: No tenderness.  Abdominal:     General: Bowel sounds are normal. There is no distension.     Palpations: Abdomen is soft. There is no mass.     Tenderness: There is no abdominal tenderness. There is no guarding or rebound.  Musculoskeletal:        General: No tenderness. Normal range of motion.     Cervical back: Normal range of motion.  Lymphadenopathy:     Cervical: No cervical adenopathy.  Skin:  General: Skin is warm and dry.     Findings: No rash.  Neurological:     Mental Status: He is alert and oriented to person, place, and time.     Cranial Nerves: No cranial nerve deficit.     Motor: No abnormal muscle tone.     Coordination: Coordination normal.     Gait: Gait normal.     Deep Tendon Reflexes: Reflexes are normal and symmetric.  Psychiatric:        Behavior: Behavior normal.        Thought Content: Thought content normal.        Judgment: Judgment normal.    Lab Results  Component Value Date   WBC 5.1 11/27/2020   HGB 14.6 11/27/2020   HCT 44.0 11/27/2020   PLT 226.0 11/27/2020   GLUCOSE 90 11/27/2020   CHOL 152 11/27/2020   TRIG 94.0 11/27/2020   HDL 31.70 (L)  11/27/2020   LDLCALC 102 (H) 11/27/2020   ALT 13 11/27/2020   ALT 13 11/27/2020   AST 17 11/27/2020   AST 17 11/27/2020   NA 140 11/27/2020   K 4.1 11/27/2020   CL 104 11/27/2020   CREATININE 1.32 11/27/2020   BUN 15 11/27/2020   CO2 28 11/27/2020   TSH <0.01 (L) 12/09/2020   PSA 1.22 11/27/2020   HGBA1C 5.9 11/27/2020    CT Soft Tissue Neck W Contrast  Result Date: 08/20/2020 CLINICAL DATA:  50 year old male with right neck and jaw swelling and pain. EXAM: CT NECK WITH CONTRAST TECHNIQUE: Multidetector CT imaging of the neck was performed using the standard protocol following the bolus administration of intravenous contrast. CONTRAST:  7mL OMNIPAQUE IOHEXOL 300 MG/ML  SOLN COMPARISON:  None. FINDINGS: Pharynx and larynx: Glottic and subglottic larynx are within normal limits. There is mild generalized pharyngeal mucosal space edema, and edema tracking along the right lateral wall of the pharynx which appears secondary to the right submandibular space abnormality. Inflammation in the right parapharyngeal space. Retropharyngeal and left parapharyngeal space remain within normal limits. No discrete pharyngeal or laryngeal mass. Scattered postinflammatory calcifications of the palatine tonsils. Salivary glands: Abnormally enlarged and heterogeneously enhancing right submandibular gland (coronal image 55) with inflammation in and around the right submandibular space. Thickening of the overlying platysma and small volume fluid/soft tissue edema tracking to the submental region from the right. No organized or rim enhancing fluid collection. No soft tissue gas. Ductal ectasia within the right gland, but no sialolithiasis or obstructing etiology identified. The anterior and central sublingual space remain within normal limits. Left submandibular gland and space are within normal limits. Mild left S mg ductal ectasia also. Parotid glands are within normal limits. Thyroid: Negative. Lymph nodes: Reactive  appearing level 1 a and right level 1 B lymph nodes. Similar reactive appearing level 2 nodes greater on the right. No cystic or necrotic nodes. Other cervical nodal stations are within normal limits. Vascular: Major vascular structures in the neck and at the skull base including the right IJ appear patent. Dominant left and diminutive right vertebral arteries (normal variant). Limited intracranial: Negative. Visualized orbits: Negative. Mastoids and visualized paranasal sinuses: Mild to moderate bilateral paranasal sinus mucosal thickening. All sinuses affected. No fluid levels. Tympanic cavities and mastoids are clear. Skeleton: No carious dentition identified but there is lucency about the right mandible wisdom tooth. However, no subperiosteal inflammation identified - and this is felt unrelated to the above submandibular space process. Mild degenerative endplate spurring in the cervical spine at  C3-C4. No acute osseous abnormality identified. Upper chest: Negative. IMPRESSION: 1. Multi-spatial right neck edema appears secondary to Acute Sialadenitis of the Right Submandibular Gland. No obstructing lesion identified, therefore favor infectious etiology (viral, bacterial). No abscess or other complicating features. 2. Mild to moderate bilateral paranasal sinus inflammation. Electronically Signed   By: Odessa Fleming M.D.   On: 08/20/2020 05:08    Assessment & Plan:   Problem List Items Addressed This Visit     Abnormal serum thyroid stimulating hormone (TSH) level     Normal sed rate and negative thyroid peroxidase antibody test.  Clinically he is euthyroid.  We will continue to monitor labs       Relevant Orders   Comprehensive metabolic panel   T3, free   T4, free   TSH   Colon cancer screening    Discussed options. He elected Cologuard      Relevant Orders   Comprehensive metabolic panel   Cologuard   Need for hepatitis C screening test    Hep C test Hep B and A vaccinated      Relevant  Orders   Comprehensive metabolic panel   Hepatitis C antibody      No orders of the defined types were placed in this encounter.     Follow-up: Return in about 6 months (around 08/19/2021) for Wellness Exam.  Sonda Primes, MD

## 2021-02-18 NOTE — Assessment & Plan Note (Signed)
Normal sed rate and negative thyroid peroxidase antibody test.  Clinically he is euthyroid.  We will continue to monitor labs

## 2021-02-18 NOTE — Addendum Note (Signed)
Addended by: Madelon Lips on: 02/18/2021 04:00 PM   Modules accepted: Orders

## 2021-02-19 LAB — HEPATITIS C ANTIBODY
Hepatitis C Ab: NONREACTIVE
SIGNAL TO CUT-OFF: 0.04 (ref <1.00)

## 2021-04-29 ENCOUNTER — Ambulatory Visit: Payer: No Typology Code available for payment source | Admitting: Allergy

## 2021-06-03 ENCOUNTER — Ambulatory Visit (INDEPENDENT_AMBULATORY_CARE_PROVIDER_SITE_OTHER): Payer: No Typology Code available for payment source | Admitting: Internal Medicine

## 2021-06-03 ENCOUNTER — Encounter: Payer: Self-pay | Admitting: Internal Medicine

## 2021-06-03 ENCOUNTER — Other Ambulatory Visit: Payer: Self-pay

## 2021-06-03 VITALS — BP 100/82 | HR 95 | Temp 98.7°F | Ht 65.0 in | Wt 177.0 lb

## 2021-06-03 DIAGNOSIS — M545 Low back pain, unspecified: Secondary | ICD-10-CM | POA: Insufficient documentation

## 2021-06-03 DIAGNOSIS — Z1211 Encounter for screening for malignant neoplasm of colon: Secondary | ICD-10-CM | POA: Diagnosis not present

## 2021-06-03 DIAGNOSIS — G8929 Other chronic pain: Secondary | ICD-10-CM | POA: Diagnosis not present

## 2021-06-03 DIAGNOSIS — R109 Unspecified abdominal pain: Secondary | ICD-10-CM | POA: Insufficient documentation

## 2021-06-03 DIAGNOSIS — R944 Abnormal results of kidney function studies: Secondary | ICD-10-CM | POA: Insufficient documentation

## 2021-06-03 MED ORDER — VITAMIN D3 50 MCG (2000 UT) PO CAPS: 2000.0000 [IU] | ORAL_CAPSULE | Freq: Every day | ORAL | 3 refills | Status: DC

## 2021-06-03 NOTE — Patient Instructions (Signed)
Back stretching

## 2021-06-03 NOTE — Progress Notes (Signed)
Subjective:  Patient ID: Thomas Mcgee, male    DOB: 04-29-1970  Age: 51 y.o. MRN: [id number]  CC: No chief complaint on file.   HPI Kanye Venecia presents for back pain - R flank.  The pain has been chronic with occasional flareup.  On occasion the pain is stabbing leg and renal colic would be.  No radiation or dysuria.  No fever or chills.  No abdominal pain.  No rash.  Follow-up on decreased GFR  Outpatient Medications Prior to Visit  Medication Sig Dispense Refill   amLODipine (NORVASC) 10 MG tablet Take 10 mg by mouth daily. Take 1 by mouth daily (Patient not taking: Reported on 11/27/2020)     No facility-administered medications prior to visit.    ROS: Review of Systems  Constitutional:  Negative for appetite change, chills, fatigue, fever and unexpected weight change.  HENT:  Negative for congestion, nosebleeds, sneezing, sore throat and trouble swallowing.   Eyes:  Negative for itching and visual disturbance.  Respiratory:  Negative for cough.   Cardiovascular:  Negative for chest pain, palpitations and leg swelling.  Gastrointestinal:  Negative for abdominal distention, blood in stool, diarrhea and nausea.  Genitourinary:  Negative for frequency and hematuria.  Musculoskeletal:  Positive for back pain. Negative for gait problem, joint swelling and neck pain.  Skin:  Negative for rash.  Neurological:  Negative for dizziness, tremors, speech difficulty and weakness.  Psychiatric/Behavioral:  Negative for agitation, dysphoric mood and sleep disturbance. The patient is not nervous/anxious.    Objective:  BP 100/82 (BP Location: Left Arm, Patient Position: Sitting, Cuff Size: Large)    Pulse 95    Temp 98.7 F (37.1 C) (Oral)    Ht 5\' 5"  (1.651 m)    Wt 177 lb (80.3 kg)    SpO2 99%    BMI 29.45 kg/m   BP Readings from Last 3 Encounters:  06/03/21 100/82  02/18/21 128/86  12/10/20 120/84    Wt Readings from Last 3 Encounters:  06/03/21 177 lb (80.3 kg)  02/18/21  179 lb 3.2 oz (81.3 kg)  12/10/20 186 lb 3.2 oz (84.5 kg)    Physical Exam Constitutional:      General: He is not in acute distress.    Appearance: He is well-developed.     Comments: NAD  Eyes:     Conjunctiva/sclera: Conjunctivae normal.     Pupils: Pupils are equal, round, and reactive to light.  Neck:     Thyroid: No thyromegaly.     Vascular: No JVD.  Cardiovascular:     Rate and Rhythm: Normal rate and regular rhythm.     Heart sounds: Normal heart sounds. No murmur heard.   No friction rub. No gallop.  Pulmonary:     Effort: Pulmonary effort is normal. No respiratory distress.     Breath sounds: Normal breath sounds. No wheezing or rales.  Chest:     Chest wall: No tenderness.  Abdominal:     General: Bowel sounds are normal. There is no distension.     Palpations: Abdomen is soft. There is no mass.     Tenderness: There is no abdominal tenderness. There is no guarding or rebound.  Musculoskeletal:        General: No tenderness. Normal range of motion.     Cervical back: Normal range of motion.  Lymphadenopathy:     Cervical: No cervical adenopathy.  Skin:    General: Skin is warm and dry.     Findings:  No rash.  Neurological:     Mental Status: He is alert and oriented to person, place, and time.     Cranial Nerves: No cranial nerve deficit.     Motor: No abnormal muscle tone.     Coordination: Coordination normal.     Gait: Gait normal.     Deep Tendon Reflexes: Reflexes are normal and symmetric.  Psychiatric:        Behavior: Behavior normal.        Thought Content: Thought content normal.        Judgment: Judgment normal.    Lab Results  Component Value Date   WBC 5.1 11/27/2020   HGB 14.6 11/27/2020   HCT 44.0 11/27/2020   PLT 226.0 11/27/2020   GLUCOSE 74 02/18/2021   CHOL 152 11/27/2020   TRIG 94.0 11/27/2020   HDL 31.70 (L) 11/27/2020   LDLCALC 102 (H) 11/27/2020   ALT 10 02/18/2021   AST 16 02/18/2021   NA 138 02/18/2021   K 4.3  02/18/2021   CL 101 02/18/2021   CREATININE 1.62 (H) 02/18/2021   BUN 13 02/18/2021   CO2 31 02/18/2021   TSH 0.64 02/18/2021   PSA 1.22 11/27/2020   HGBA1C 5.9 11/27/2020    CT Soft Tissue Neck W Contrast  Result Date: 08/20/2020 CLINICAL DATA:  51 year old male with right neck and jaw swelling and pain. EXAM: CT NECK WITH CONTRAST TECHNIQUE: Multidetector CT imaging of the neck was performed using the standard protocol following the bolus administration of intravenous contrast. CONTRAST:  82mL OMNIPAQUE IOHEXOL 300 MG/ML  SOLN COMPARISON:  None. FINDINGS: Pharynx and larynx: Glottic and subglottic larynx are within normal limits. There is mild generalized pharyngeal mucosal space edema, and edema tracking along the right lateral wall of the pharynx which appears secondary to the right submandibular space abnormality. Inflammation in the right parapharyngeal space. Retropharyngeal and left parapharyngeal space remain within normal limits. No discrete pharyngeal or laryngeal mass. Scattered postinflammatory calcifications of the palatine tonsils. Salivary glands: Abnormally enlarged and heterogeneously enhancing right submandibular gland (coronal image 55) with inflammation in and around the right submandibular space. Thickening of the overlying platysma and small volume fluid/soft tissue edema tracking to the submental region from the right. No organized or rim enhancing fluid collection. No soft tissue gas. Ductal ectasia within the right gland, but no sialolithiasis or obstructing etiology identified. The anterior and central sublingual space remain within normal limits. Left submandibular gland and space are within normal limits. Mild left S mg ductal ectasia also. Parotid glands are within normal limits. Thyroid: Negative. Lymph nodes: Reactive appearing level 1 a and right level 1 B lymph nodes. Similar reactive appearing level 2 nodes greater on the right. No cystic or necrotic nodes. Other  cervical nodal stations are within normal limits. Vascular: Major vascular structures in the neck and at the skull base including the right IJ appear patent. Dominant left and diminutive right vertebral arteries (normal variant). Limited intracranial: Negative. Visualized orbits: Negative. Mastoids and visualized paranasal sinuses: Mild to moderate bilateral paranasal sinus mucosal thickening. All sinuses affected. No fluid levels. Tympanic cavities and mastoids are clear. Skeleton: No carious dentition identified but there is lucency about the right mandible wisdom tooth. However, no subperiosteal inflammation identified - and this is felt unrelated to the above submandibular space process. Mild degenerative endplate spurring in the cervical spine at C3-C4. No acute osseous abnormality identified. Upper chest: Negative. IMPRESSION: 1. Multi-spatial right neck edema appears secondary to Acute Sialadenitis of  the Right Submandibular Gland. No obstructing lesion identified, therefore favor infectious etiology (viral, bacterial). No abscess or other complicating features. 2. Mild to moderate bilateral paranasal sinus inflammation. Electronically Signed   By: Genevie Ann M.D.   On: 08/20/2020 05:08    Assessment & Plan:   Problem List Items Addressed This Visit     Colon cancer screening - Primary    Cologuard reordered.      Relevant Orders   Cologuard   Decreased GFR    New.  GFR 49.4.  Hydrate well.  Obtain renal ultrasound      Relevant Orders   US RENAL   Urinalysis   Flank pain, acute    Acute on chronic R flank pain Rule out nephrolithiasis Obtain UA.  Ordered renal ultrasound Obtain lumbar spine x-ray      Relevant Orders   US RENAL   DG Lumbar Spine 2-3 Views   Comprehensive metabolic panel   Urinalysis   Low back pain    Acute on chronic R flank 1-2 years Obtain renal ultrasound RICE heating pad Stretching exercises Lumbar spine x-ray      Relevant Orders   DG Lumbar Spine  2-3 Views   Comprehensive metabolic panel   Urinalysis      Meds ordered this encounter  Medications   Cholecalciferol (VITAMIN D3) 50 MCG (2000 UT) capsule    Sig: Take 1 capsule (2,000 Units total) by mouth daily.    Dispense:  100 capsule    Refill:  3      Follow-up: Return in about 6 weeks (around 07/15/2021) for a follow-up visit.  Walker Kehr, MD

## 2021-06-03 NOTE — Assessment & Plan Note (Addendum)
Acute on chronic R flank pain Rule out nephrolithiasis Obtain UA.  Ordered renal ultrasound Obtain lumbar spine x-ray

## 2021-06-03 NOTE — Assessment & Plan Note (Addendum)
Acute on chronic R flank 1-2 years Obtain renal ultrasound RICE heating pad Stretching exercises Lumbar spine x-ray

## 2021-06-04 NOTE — Assessment & Plan Note (Signed)
New.  GFR 49.4.  Hydrate well.  Obtain renal ultrasound

## 2021-06-04 NOTE — Assessment & Plan Note (Signed)
Cologuard re-ordered.

## 2021-06-10 ENCOUNTER — Ambulatory Visit (INDEPENDENT_AMBULATORY_CARE_PROVIDER_SITE_OTHER): Payer: No Typology Code available for payment source

## 2021-06-10 ENCOUNTER — Ambulatory Visit
Admission: RE | Admit: 2021-06-10 | Discharge: 2021-06-10 | Disposition: A | Discharge status: Home / Self Care | Payer: No Typology Code available for payment source | Source: Ambulatory Visit | Attending: Internal Medicine | Admitting: Internal Medicine

## 2021-06-10 DIAGNOSIS — M545 Low back pain, unspecified: Secondary | ICD-10-CM

## 2021-06-10 DIAGNOSIS — R109 Unspecified abdominal pain: Secondary | ICD-10-CM

## 2021-06-10 DIAGNOSIS — R944 Abnormal results of kidney function studies: Secondary | ICD-10-CM

## 2021-06-10 DIAGNOSIS — G8929 Other chronic pain: Secondary | ICD-10-CM

## 2021-06-10 LAB — URINALYSIS
Bilirubin Urine: NEGATIVE
Hgb urine dipstick: NEGATIVE
Ketones, ur: NEGATIVE
Leukocytes,Ua: NEGATIVE
Nitrite: NEGATIVE
Specific Gravity, Urine: 1.015 (ref 1.000–1.030)
Total Protein, Urine: NEGATIVE
Urine Glucose: NEGATIVE
Urobilinogen, UA: 0.2 (ref 0.0–1.0)
pH: 6 (ref 5.0–8.0)

## 2021-06-10 LAB — COMPREHENSIVE METABOLIC PANEL
ALT: 10 U/L (ref 0–53)
AST: 18 U/L (ref 0–37)
Albumin: 4.4 g/dL (ref 3.5–5.2)
Alkaline Phosphatase: 50 U/L (ref 39–117)
BUN: 18 mg/dL (ref 6–23)
CO2: 33 mEq/L — ABNORMAL HIGH (ref 19–32)
Calcium: 9.5 mg/dL (ref 8.4–10.5)
Chloride: 102 mEq/L (ref 96–112)
Creatinine, Ser: 1.52 mg/dL — ABNORMAL HIGH (ref 0.40–1.50)
GFR: 53.3 mL/min — ABNORMAL LOW (ref >60.00)
Glucose, Bld: 94 mg/dL (ref 70–99)
Potassium: 3.8 mEq/L (ref 3.5–5.1)
Sodium: 138 mEq/L (ref 135–145)
Total Bilirubin: 0.8 mg/dL (ref 0.2–1.2)
Total Protein: 7.1 g/dL (ref 6.0–8.3)

## 2021-08-23 ENCOUNTER — Encounter: Payer: Self-pay | Admitting: Internal Medicine

## 2021-08-23 ENCOUNTER — Ambulatory Visit (INDEPENDENT_AMBULATORY_CARE_PROVIDER_SITE_OTHER): Payer: No Typology Code available for payment source | Admitting: Internal Medicine

## 2021-08-23 VITALS — BP 100/70 | HR 82 | Temp 98.7°F | Ht 65.0 in | Wt 182.0 lb

## 2021-08-23 DIAGNOSIS — Z7184 Encounter for health counseling related to travel: Secondary | ICD-10-CM | POA: Diagnosis not present

## 2021-08-23 DIAGNOSIS — R944 Abnormal results of kidney function studies: Secondary | ICD-10-CM

## 2021-08-23 DIAGNOSIS — J301 Allergic rhinitis due to pollen: Secondary | ICD-10-CM

## 2021-08-23 DIAGNOSIS — Z0000 Encounter for general adult medical examination without abnormal findings: Secondary | ICD-10-CM | POA: Diagnosis not present

## 2021-08-23 MED ORDER — AMLODIPINE BESYLATE 10 MG PO TABS: 10.0000 mg | ORAL_TABLET | Freq: Every day | ORAL | 3 refills | Status: DC

## 2021-08-23 MED ORDER — LORATADINE 10 MG PO TABS: 10.0000 mg | ORAL_TABLET | Freq: Every day | ORAL | 1 refills | Status: DC

## 2021-08-23 MED ORDER — ATOVAQUONE-PROGUANIL HCL 250-100 MG PO TABS: ORAL_TABLET | ORAL | 1 refills | Status: DC

## 2021-08-23 MED ORDER — FLUTICASONE PROPIONATE 50 MCG/ACT NA SUSP: 2.0000 | Freq: Every day | NASAL | 6 refills | Status: DC

## 2021-08-23 NOTE — Progress Notes (Signed)
? ?Subjective:  ?Patient ID: Thomas Mcgee, male    DOB: 07/31/1970  Age: 51 y.o. MRN: [id number] ? ?CC: No chief complaint on file. ? ? ?HPI ?Thomas Mcgee presents for allergies - every spring ?F/u HTN, CRF ?Planning to travel to [place] x 2 mo ?Outpatient Medications Prior to Visit  ?Medication Sig Dispense Refill  ? Cholecalciferol (VITAMIN D3) 50 MCG (2000 UT) capsule Take 1 capsule (2,000 Units total) by mouth daily. 100 capsule 3  ? amLODipine (NORVASC) 10 MG tablet Take 10 mg by mouth daily. Take 1 by mouth daily    ? ?No facility-administered medications prior to visit.  ? ? ?ROS: ?Review of Systems  ?Constitutional:  Negative for appetite change, fatigue and unexpected weight change.  ?HENT:  Positive for congestion, postnasal drip and rhinorrhea. Negative for nosebleeds, sneezing, sore throat and trouble swallowing.   ?Eyes:  Negative for itching and visual disturbance.  ?Respiratory:  Negative for cough.   ?Cardiovascular:  Negative for chest pain, palpitations and leg swelling.  ?Gastrointestinal:  Negative for abdominal distention, blood in stool, diarrhea and nausea.  ?Genitourinary:  Negative for frequency and hematuria.  ?Musculoskeletal:  Negative for back pain, gait problem, joint swelling and neck pain.  ?Skin:  Negative for rash.  ?Neurological:  Negative for dizziness, tremors, speech difficulty and weakness.  ?Psychiatric/Behavioral:  Negative for agitation, dysphoric mood and sleep disturbance. The patient is not nervous/anxious.   ? ?Objective:  ?BP 100/70 (BP Location: Left Arm, Patient Position: Sitting, Cuff Size: Normal)   Pulse 82   Temp 98.7 ?F (37.1 ?C) (Oral)   Ht 5\' 5"  (1.651 m)   Wt 182 lb (82.6 kg)   SpO2 96%   BMI 30.29 kg/m?  ? ?BP Readings from Last 3 Encounters:  ?08/23/21 100/70  ?06/03/21 100/82  ?02/18/21 128/86  ? ? ?Wt Readings from Last 3 Encounters:  ?08/23/21 182 lb (82.6 kg)  ?06/03/21 177 lb (80.3 kg)  ?02/18/21 179 lb 3.2 oz (81.3 kg)  ? ? ?Physical  Exam ?Constitutional:   ?   General: He is not in acute distress. ?   Appearance: Normal appearance. He is well-developed.  ?   Comments: NAD  ?HENT:  ?   Right Ear: External ear normal.  ?   Left Ear: External ear normal.  ?   Nose: Congestion and rhinorrhea present.  ?Eyes:  ?   Conjunctiva/sclera: Conjunctivae normal.  ?   Pupils: Pupils are equal, round, and reactive to light.  ?Neck:  ?   Thyroid: No thyromegaly.  ?   Vascular: No JVD.  ?Cardiovascular:  ?   Rate and Rhythm: Normal rate and regular rhythm.  ?   Heart sounds: Normal heart sounds. No murmur heard. ?  No friction rub. No gallop.  ?Pulmonary:  ?   Effort: Pulmonary effort is normal. No respiratory distress.  ?   Breath sounds: Normal breath sounds. No wheezing or rales.  ?Chest:  ?   Chest wall: No tenderness.  ?Abdominal:  ?   General: Bowel sounds are normal. There is no distension.  ?   Palpations: Abdomen is soft. There is no mass.  ?   Tenderness: There is no abdominal tenderness. There is no guarding or rebound.  ?Musculoskeletal:     ?   General: No tenderness. Normal range of motion.  ?   Cervical back: Normal range of motion.  ?Lymphadenopathy:  ?   Cervical: No cervical adenopathy.  ?Skin: ?   General: Skin is warm and  dry.  ?   Findings: No rash.  ?Neurological:  ?   Mental Status: He is alert and oriented to person, place, and time.  ?   Cranial Nerves: No cranial nerve deficit.  ?   Motor: No abnormal muscle tone.  ?   Coordination: Coordination normal.  ?   Gait: Gait normal.  ?   Deep Tendon Reflexes: Reflexes are normal and symmetric.  ?Psychiatric:     ?   Behavior: Behavior normal.     ?   Thought Content: Thought content normal.     ?   Judgment: Judgment normal.  ? ? ?Lab Results  ?Component Value Date  ? WBC 5.1 11/27/2020  ? HGB 14.6 11/27/2020  ? HCT 44.0 11/27/2020  ? PLT 226.0 11/27/2020  ? GLUCOSE 94 06/10/2021  ? CHOL 152 11/27/2020  ? TRIG 94.0 11/27/2020  ? HDL 31.70 (L) 11/27/2020  ? LDLCALC 102 (H) 11/27/2020  ? ALT  10 06/10/2021  ? AST 18 06/10/2021  ? NA 138 06/10/2021  ? K 3.8 06/10/2021  ? CL 102 06/10/2021  ? CREATININE 1.52 (H) 06/10/2021  ? BUN 18 06/10/2021  ? CO2 33 (H) 06/10/2021  ? TSH 0.64 02/18/2021  ? PSA 1.22 11/27/2020  ? HGBA1C 5.9 11/27/2020  ? ? ?DG Lumbar Spine 2-3 Views ? ?Result Date: 06/10/2021 ?CLINICAL DATA:  Right lumbar spine pain x 2 weeks.  No injury. EXAM: LUMBAR SPINE - 2-3 VIEW COMPARISON:  None. FINDINGS: Vertebral body alignment, heights and disc space heights are normal. Mild facet arthropathy over the lower lumbar spine. No evidence of compression fracture or spondylolisthesis. Few small phleboliths over the left pelvis. IMPRESSION: No acute findings. Electronically Signed   By: Marin Olp M.D.   On: 06/10/2021 13:03  ? ?US RENAL ? ?Result Date: 06/11/2021 ?CLINICAL DATA:  R LBP, colics - worse. R/o stones. Decreased GFR. Hypertension EXAM: RENAL / URINARY TRACT ULTRASOUND COMPLETE COMPARISON:  None. FINDINGS: Right Kidney: Renal measurements: 8.1 x 3.8 x 5 cm = volume: 79 mL. Echogenicity within normal limits. No mass or hydronephrosis visualized. Left Kidney: Renal measurements: 8.9 x 5.1 x 4.8 cm = volume: 113 mL. Echogenicity within normal limits. No mass or hydronephrosis visualized. Urinary bladder: Appears normal for degree of bladder distention. Other: The prostate is prominent in size measuring up to 3.1 x 4.9 x 4.8 cm. IMPRESSION: 1. Unremarkable renal ultrasound. 2. Prominent prostate. Electronically Signed   By: Iven Finn M.D.   On: 06/11/2021 01:32  ? ? ?Assessment & Plan:  ? ?Problem List Items Addressed This Visit   ? ? Well adult exam  ? Relevant Orders  ? TSH  ? Urinalysis  ? CBC with Differential/Platelet  ? Lipid panel  ? PSA  ? Comprehensive metabolic panel  ? Travel advice encounter  ?  ASA on the days of travel for DVT prophylaxis. ?Malarone: 1 po qd - start 2 d before travel, take daily while in [place] and x 1 wk after return. Then stop ? ?  ?  ? Decreased GFR  ?   Monitor GFR ? ?  ?  ? Allergic rhinitis due to pollen - Primary  ?  Worse ?Start Loratadine 10 mg qd and Flonase qd for 1-2 mo during allergy seasn ?D/c Afrin ? ?  ?  ?  ? ? ?Meds ordered this encounter  ?Medications  ? amLODipine (NORVASC) 10 MG tablet  ?  Sig: Take 1 tablet (10 mg total) by mouth daily. Take  1 by mouth daily  ?  Dispense:  90 tablet  ?  Refill:  3  ? loratadine (CLARITIN) 10 MG tablet  ?  Sig: Take 1 tablet (10 mg total) by mouth daily.  ?  Dispense:  90 tablet  ?  Refill:  1  ? fluticasone (FLONASE) 50 MCG/ACT nasal spray  ?  Sig: Place 2 sprays into both nostrils daily.  ?  Dispense:  16 g  ?  Refill:  6  ? atovaquone-proguanil (MALARONE) 250-100 MG TABS tablet  ?  Sig: 1 po qd - start 2 d before travel, take daily while in [place] and x 1 wk after return. Then stop  ?  Dispense:  68 tablet  ?  Refill:  1  ?  ? ? ?Follow-up: Return in about 6 months (around 02/23/2022) for Wellness Exam. ? ?Walker Kehr, MD ?

## 2021-08-23 NOTE — Assessment & Plan Note (Signed)
Worse ?Start Loratadine 10 mg qd and Flonase qd for 1-2 mo during allergy seasn ?D/c Afrin ?

## 2021-08-23 NOTE — Assessment & Plan Note (Signed)
ASA on the days of travel for DVT prophylaxis. ?Malarone: 1 po qd - start 2 d before travel, take daily while in [place] and x 1 wk after return. Then stop ?

## 2021-08-23 NOTE — Assessment & Plan Note (Signed)
Monitor GFR 

## 2021-08-26 ENCOUNTER — Ambulatory Visit: Payer: No Typology Code available for payment source | Admitting: Internal Medicine

## 2021-10-04 ENCOUNTER — Telehealth: Payer: Self-pay | Admitting: *Deleted

## 2021-10-04 NOTE — Telephone Encounter (Signed)
Pt was on cover-my-meds needing PA completed w/  (Key: [id number]). Rec'd msg Your information has been sent to [facility]. Will recheck later.Marland KitchenJohny Chess

## 2021-10-04 NOTE — Telephone Encounter (Signed)
Rec'd determination back med was APPROVED. effective 10/04/21 - 10/04/2022. Faxed approval to [facility].../l,mb

## 2021-11-12 ENCOUNTER — Encounter: Payer: Self-pay | Admitting: Internal Medicine

## 2021-12-31 ENCOUNTER — Encounter: Payer: Self-pay | Admitting: Internal Medicine

## 2022-01-03 ENCOUNTER — Other Ambulatory Visit: Payer: Self-pay | Admitting: Internal Medicine

## 2022-01-03 DIAGNOSIS — Z1211 Encounter for screening for malignant neoplasm of colon: Secondary | ICD-10-CM

## 2022-01-12 ENCOUNTER — Other Ambulatory Visit: Payer: Self-pay | Admitting: Internal Medicine

## 2022-01-12 DIAGNOSIS — Z1211 Encounter for screening for malignant neoplasm of colon: Secondary | ICD-10-CM

## 2022-02-14 ENCOUNTER — Telehealth: Payer: Self-pay | Admitting: Family Medicine

## 2022-02-14 NOTE — Telephone Encounter (Signed)
Appt scheduled for 02/17/2022.

## 2022-02-14 NOTE — Telephone Encounter (Signed)
Dr. Georgina Snell ordered a foot MRI for patient 11/2020. Pt travels frequently and never got this done. He has the same symptoms and the time to get this done now. But [place] will not accept the old order.  Can we order MRI or does pt need a visit?

## 2022-02-16 NOTE — Progress Notes (Unsigned)
   Rito Ehrlich, am serving as a Education administrator for Dr. Lynne Leader.  [place] Delauder is a 51 y.o. male who presents to [place] at [facility] today for Left foot pain. Patient was last seen by Dr. Georgina Snell on 12/10/20 bilateral foot pain. Patient states today that the left foot is painful on the sole of his foot he was going back and forth between here and [place] so didn't have time to get an MRI and would like to get on now that he will be here.    Pertinent review of systems: No fevers or chills  Relevant historical information: Hypertension lives in [place] but works in [place].  He is in [place] every Thursday.   Exam:  BP (!) 130/90   Pulse 84   Ht 5\' 5"  (1.651 m)   Wt 183 lb (83 kg)   SpO2 94%   BMI 30.45 kg/m  General: Well Developed, well nourished, and in no acute distress.   MSK: Left foot normal appearing Palpable superficial mass at plantar foot overlying the mid to distal first metatarsal.  This is proximal to the metatarsal head.  This is mildly tender to palpation.  Foot is otherwise normal-appearing with no palpable masses and nontender.  Normal foot and ankle motion.    Lab and Radiology Results   EXAM: LEFT FOOT - COMPLETE 3+ VIEW   COMPARISON:  None.   FINDINGS: There is no evidence of fracture or dislocation. There is no evidence of arthropathy or other focal bone abnormality. Soft tissues are unremarkable.   IMPRESSION: Negative.     Electronically Signed   By: Rolm Baptise M.D.   On: 12/11/2020 14:53   I, Lynne Leader, personally (independently) visualized and performed the interpretation of the images attached in this note.    Assessment and Plan: 51 y.o. male with left foot plantar nodule.  Etiology is unclear.  Ultrasound previously was unclear.  I think at this point an MRI is indicated to further evaluate the nodule for potential surgical excision or other treatment such as injection or shockwave.  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   PDMP not reviewed this encounter. Orders Placed This Encounter  Procedures   MR FOOT LEFT W WO CONTRAST    Standing Status:   Future    Standing Expiration Date:   02/18/2023    Order Specific Question:   If indicated for the ordered procedure, I authorize the administration of contrast media per Radiology protocol    Answer:   Yes    Order Specific Question:   What is the patient's sedation requirement?    Answer:   No Sedation    Order Specific Question:   Does the patient have a pacemaker or implanted devices?    Answer:   No    Order Specific Question:   Preferred imaging location?    Answer:   [facility] (table limit-550lbs)   No orders of the defined types were placed in this encounter.    Discussed warning signs or symptoms. Please see discharge instructions. Patient expresses understanding.   The above documentation has been reviewed and is accurate and complete Lynne Leader, M.D.

## 2022-02-17 ENCOUNTER — Ambulatory Visit (INDEPENDENT_AMBULATORY_CARE_PROVIDER_SITE_OTHER): Payer: No Typology Code available for payment source | Admitting: Family Medicine

## 2022-02-17 VITALS — BP 130/90 | HR 84 | Ht 65.0 in | Wt 183.0 lb

## 2022-02-17 DIAGNOSIS — R2242 Localized swelling, mass and lump, left lower limb: Secondary | ICD-10-CM

## 2022-02-17 NOTE — Patient Instructions (Addendum)
Thank you for coming in today.   You should hear from MRI scheduling within 1 week. If you do not hear please let me know.    After the MRI if we need to talk we could do it over video if you are not in town.   If surgery is indicated I can get you to a surgeon.

## 2022-03-23 ENCOUNTER — Encounter: Payer: Self-pay | Admitting: Family Medicine

## 2022-03-27 ENCOUNTER — Other Ambulatory Visit: Payer: No Typology Code available for payment source

## 2022-03-31 ENCOUNTER — Ambulatory Visit (INDEPENDENT_AMBULATORY_CARE_PROVIDER_SITE_OTHER): Payer: No Typology Code available for payment source | Admitting: Internal Medicine

## 2022-03-31 ENCOUNTER — Encounter: Payer: Self-pay | Admitting: Internal Medicine

## 2022-03-31 VITALS — BP 110/72 | HR 72 | Temp 97.8°F | Ht 65.0 in | Wt 185.0 lb

## 2022-03-31 DIAGNOSIS — J0100 Acute maxillary sinusitis, unspecified: Secondary | ICD-10-CM | POA: Diagnosis not present

## 2022-03-31 DIAGNOSIS — J329 Chronic sinusitis, unspecified: Secondary | ICD-10-CM | POA: Insufficient documentation

## 2022-03-31 MED ORDER — AZITHROMYCIN 250 MG PO TABS: ORAL_TABLET | ORAL | 0 refills | Status: DC

## 2022-03-31 NOTE — Assessment & Plan Note (Signed)
Start a Zpac NS nasal spray

## 2022-03-31 NOTE — Progress Notes (Signed)
Subjective:  Patient ID: Thomas Mcgee, male    DOB: 1970/08/25  Age: 51 y.o. MRN: [id number]  CC: Epistaxis (2 weeks ago)   HPI Zachari Alberta presents for bloody nasal d/c. Yellow mucus  Outpatient Medications Prior to Visit  Medication Sig Dispense Refill   amLODipine (NORVASC) 10 MG tablet Take 1 tablet (10 mg total) by mouth daily. Take 1 by mouth daily 90 tablet 3   fluticasone (FLONASE) 50 MCG/ACT nasal spray Place 2 sprays into both nostrils daily. 16 g 6   loratadine (CLARITIN) 10 MG tablet Take 1 tablet (10 mg total) by mouth daily. 90 tablet 1   atovaquone-proguanil (MALARONE) 250-100 MG TABS tablet 1 po qd - start 2 d before travel, take daily while in [place] and x 1 wk after return. Then stop (Patient not taking: Reported on 03/31/2022) 68 tablet 1   Cholecalciferol (VITAMIN D3) 50 MCG (2000 UT) capsule Take 1 capsule (2,000 Units total) by mouth daily. (Patient not taking: Reported on 02/17/2022) 100 capsule 3   No facility-administered medications prior to visit.    ROS: Review of Systems  Constitutional:  Negative for appetite change, fatigue and unexpected weight change.  HENT:  Positive for nosebleeds, rhinorrhea and sinus pressure. Negative for congestion, sneezing, sore throat and trouble swallowing.   Eyes:  Negative for itching and visual disturbance.  Respiratory:  Negative for cough.   Cardiovascular:  Negative for chest pain, palpitations and leg swelling.  Gastrointestinal:  Negative for abdominal distention, blood in stool, diarrhea and nausea.  Genitourinary:  Negative for frequency and hematuria.  Musculoskeletal:  Negative for back pain, gait problem, joint swelling and neck pain.  Skin:  Negative for rash.  Neurological:  Negative for dizziness, tremors, speech difficulty and weakness.  Psychiatric/Behavioral:  Negative for agitation, dysphoric mood and sleep disturbance. The patient is not nervous/anxious.     Objective:  BP 110/72 (BP Location:  Left Arm, Patient Position: Sitting, Cuff Size: Normal)   Pulse 72   Temp 97.8 F (36.6 C) (Oral)   Ht 5\' 5"  (1.651 m)   Wt 185 lb (83.9 kg)   SpO2 96%   BMI 30.79 kg/m   BP Readings from Last 3 Encounters:  03/31/22 110/72  02/17/22 (!) 130/90  08/23/21 100/70    Wt Readings from Last 3 Encounters:  03/31/22 185 lb (83.9 kg)  02/17/22 183 lb (83 kg)  08/23/21 182 lb (82.6 kg)    Physical Exam Constitutional:      General: He is not in acute distress.    Appearance: He is well-developed.     Comments: NAD  Eyes:     Conjunctiva/sclera: Conjunctivae normal.     Pupils: Pupils are equal, round, and reactive to light.  Neck:     Thyroid: No thyromegaly.     Vascular: No JVD.  Cardiovascular:     Rate and Rhythm: Normal rate and regular rhythm.     Heart sounds: Normal heart sounds. No murmur heard.    No friction rub. No gallop.  Pulmonary:     Effort: Pulmonary effort is normal. No respiratory distress.     Breath sounds: Normal breath sounds. No wheezing or rales.  Chest:     Chest wall: No tenderness.  Abdominal:     General: Bowel sounds are normal. There is no distension.     Palpations: Abdomen is soft. There is no mass.     Tenderness: There is no abdominal tenderness. There is no guarding or rebound.  Musculoskeletal:        General: No tenderness. Normal range of motion.     Cervical back: Normal range of motion.  Lymphadenopathy:     Cervical: No cervical adenopathy.  Skin:    General: Skin is warm and dry.     Findings: No rash.  Neurological:     Mental Status: He is alert and oriented to person, place, and time.     Cranial Nerves: No cranial nerve deficit.     Motor: No abnormal muscle tone.     Coordination: Coordination normal.     Gait: Gait normal.     Deep Tendon Reflexes: Reflexes are normal and symmetric.  Psychiatric:        Behavior: Behavior normal.        Thought Content: Thought content normal.        Judgment: Judgment normal.     Swollen nasal mucosa Lab Results  Component Value Date   WBC 5.1 11/27/2020   HGB 14.6 11/27/2020   HCT 44.0 11/27/2020   PLT 226.0 11/27/2020   GLUCOSE 94 06/10/2021   CHOL 152 11/27/2020   TRIG 94.0 11/27/2020   HDL 31.70 (L) 11/27/2020   LDLCALC 102 (H) 11/27/2020   ALT 10 06/10/2021   AST 18 06/10/2021   NA 138 06/10/2021   K 3.8 06/10/2021   CL 102 06/10/2021   CREATININE 1.52 (H) 06/10/2021   BUN 18 06/10/2021   CO2 33 (H) 06/10/2021   TSH 0.64 02/18/2021   PSA 1.22 11/27/2020   HGBA1C 5.9 11/27/2020    US RENAL  Result Date: 06/11/2021 CLINICAL DATA:  R LBP, colics - worse. R/o stones. Decreased GFR. Hypertension EXAM: RENAL / URINARY TRACT ULTRASOUND COMPLETE COMPARISON:  None. FINDINGS: Right Kidney: Renal measurements: 8.1 x 3.8 x 5 cm = volume: 79 mL. Echogenicity within normal limits. No mass or hydronephrosis visualized. Left Kidney: Renal measurements: 8.9 x 5.1 x 4.8 cm = volume: 113 mL. Echogenicity within normal limits. No mass or hydronephrosis visualized. Urinary bladder: Appears normal for degree of bladder distention. Other: The prostate is prominent in size measuring up to 3.1 x 4.9 x 4.8 cm. IMPRESSION: 1. Unremarkable renal ultrasound. 2. Prominent prostate. Electronically Signed   By: Tish Frederickson M.D.   On: 06/11/2021 01:32   DG Lumbar Spine 2-3 Views  Result Date: 06/10/2021 CLINICAL DATA:  Right lumbar spine pain x 2 weeks.  No injury. EXAM: LUMBAR SPINE - 2-3 VIEW COMPARISON:  None. FINDINGS: Vertebral body alignment, heights and disc space heights are normal. Mild facet arthropathy over the lower lumbar spine. No evidence of compression fracture or spondylolisthesis. Few small phleboliths over the left pelvis. IMPRESSION: No acute findings. Electronically Signed   By: Elberta Fortis M.D.   On: 06/10/2021 13:03    Assessment & Plan:   Problem List Items Addressed This Visit     Sinusitis - Primary    Start a Zpac NS nasal spray       Relevant Medications   azithromycin (ZITHROMAX Z-PAK) 250 MG tablet      Meds ordered this encounter  Medications   azithromycin (ZITHROMAX Z-PAK) 250 MG tablet    Sig: As directed    Dispense:  6 tablet    Refill:  0      Follow-up: Return for a follow-up visit.  Sonda Primes, MD

## 2022-05-01 ENCOUNTER — Other Ambulatory Visit: Payer: No Typology Code available for payment source

## 2022-05-02 ENCOUNTER — Encounter: Payer: Self-pay | Admitting: Internal Medicine

## 2022-05-02 ENCOUNTER — Telehealth: Payer: Self-pay

## 2022-05-02 NOTE — Telephone Encounter (Signed)
Pt has asked that his PCP give him a call about a family health issue. He did not elaborate nor clarify on what issue it is he needs advice about. Pt just asked that PCP reach out to him.

## 2022-05-04 NOTE — Telephone Encounter (Signed)
Called pt back he states when he was in here he ask MD can his son " Thomas Mcgee" become a new patient of his. Son just came to the [place] as of yesterday.Marland KitchenJohny Chess

## 2022-05-04 NOTE — Telephone Encounter (Signed)
No need to see him in the office or schedule a telephone visit?

## 2022-05-06 NOTE — Telephone Encounter (Signed)
Okay if he is 19 or older.  Thank you

## 2022-05-09 NOTE — Telephone Encounter (Signed)
Notified pt father with MD response. Father said he will call back today once he speak w/ son to make appt...Andee Poles

## 2022-05-31 ENCOUNTER — Telehealth: Payer: Self-pay | Admitting: Internal Medicine

## 2022-05-31 NOTE — Telephone Encounter (Signed)
Patient returned call from Lorre Nick about his son. He would like a callback at [phone]

## 2022-05-31 NOTE — Telephone Encounter (Signed)
Called pt back he was going to make appt for son., but he states he did not have his schedule. Since son will be new in system pt will have schedulers to make the appt.. See previous msg MD has ok'd new patient for son.Marland KitchenJohny Chess

## 2022-06-23 ENCOUNTER — Telehealth: Payer: Self-pay | Admitting: Family Medicine

## 2022-06-23 NOTE — Telephone Encounter (Signed)
Pt never had the MRI foot done in January. Per [facility] a new Thomas Mcgee is needed.  Please let pt know when done and he will reach out to [place] for scheduling.

## 2022-06-24 NOTE — Telephone Encounter (Signed)
I will call patients insurance on Monday to get this mri extended. This was ordered and approved in October 2023 and expired in November 2023. Since patient never got this I cannot re-do it online I have to call and extend the auth date

## 2022-06-27 NOTE — Telephone Encounter (Signed)
Called to extend date we will receive a fax once they extended the date. It takes about 24 hours

## 2022-06-28 NOTE — Telephone Encounter (Signed)
Tried calling patient but mailbox was full

## 2022-06-28 NOTE — Telephone Encounter (Signed)
Expiration date has been extended to 07/29/2022

## 2022-07-28 ENCOUNTER — Ambulatory Visit
Admission: RE | Admit: 2022-07-28 | Discharge: 2022-07-28 | Disposition: A | Discharge status: Home / Self Care | Payer: No Typology Code available for payment source | Source: Ambulatory Visit | Attending: Family Medicine | Admitting: Family Medicine

## 2022-07-28 DIAGNOSIS — R2242 Localized swelling, mass and lump, left lower limb: Secondary | ICD-10-CM

## 2022-07-28 MED ORDER — GADOPICLENOL 0.5 MMOL/ML IV SOLN
9.0000 mL | Freq: Once | INTRAVENOUS | Status: AC | PRN
  Administered 2022-07-28: 9 mL via INTRAVENOUS

## 2022-08-01 NOTE — Progress Notes (Signed)
Left foot MRI shows a plantar fibroma along the plantar aspect of the foot.  I can refer you to a surgeon if he would like to have it removed otherwise we can try to treat it with offloading pressure from it.  You want a referral?  Alternatively you could return to clinic and we can talk about the results in full detail and talk about treatment options.

## 2022-08-03 NOTE — Progress Notes (Unsigned)
Rubin Payor, PhD, LAT, ATC acting as a scribe for Clementeen Graham, MD.  Thomas Mcgee is a 52 y.o. male who presents to [facility] today for f/u L foot pain/mass and MRI review. Pt was last seen by Dr. Denyse Amass on 02/17/22 and was advised to proceed to MRI. Pt was delayed in getting MRI, it had to be re-authorized.  Today, pt reports L foot started to hurt about a week ago. Pt is wanting to go over the MRI to learn what the issue is.  Pain is located in the lateral aspect of the foot at the distal metatarsal  Dx imaging: 07/28/22 L foot MRI  12/10/20 L foot XR  Pertinent review of systems: No fevers or chills  Relevant historical information: Hypertension   Exam:  BP (!) 146/92   Pulse 95   Ht 5\' 5"  (1.651 m)   Wt 189 lb (85.7 kg)   SpO2 97%   BMI 31.45 kg/m  General: Well Developed, well nourished, and in no acute distress.   MSK: Left foot: Normal appearing Palpable nodule present at the medial aspect of the plantar foot proximal to first MTP.  This is mildly tender to palpation. Additionally he has some tenderness to palpation at the distal fifth metatarsal region.    Lab and Radiology Results  EXAM: MRI OF THE LEFT FOREFOOT WITHOUT AND WITH CONTRAST   TECHNIQUE: Multiplanar, multisequence MR imaging of the left foot was performed both before and after administration of intravenous contrast.   CONTRAST:  9 mL Vueway IV.   COMPARISON:  None Available.   FINDINGS: Bones/Joint/Cartilage   No fracture, stress change or worrisome lesion is identified. No joint effusion. No evidence of arthropathy.   Ligaments   Intact.   Muscles and Tendons   Intact.   Soft tissues   There is a lesion measuring 2.1 cm long by 1 cm craniocaudal by 0.8 cm transverse in the subcutaneous tissues along the plantar aspect proximal diaphysis of the first metatarsal. The lesion is T1 and T2 hypointense and demonstrates heterogeneous enhancement.  It emanates from the plantar fascia.   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     Electronically Signed   By: Drusilla Kanner M.D.   On: 07/31/2022 08:25 I, Clementeen Graham, personally (independently) visualized and performed the interpretation of the images attached in this note.      Assessment and Plan: 52 y.o. male with plantar foot nodule is a plantar fibroma.  It is large enough and poorly located enough that I do not think conservative management is going to be helpful and has not proven to be very helpful so far.  Surgery is probably his best option.  Will refer him to podiatry for surgical consultation.  He is developing pain in the lateral aspect of his foot.  I think he is having to offload pressure from the medial aspect of his plantar foot to more the lateral aspect of his foot causing pain in the structures there.  I fear he will develop a stress reaction.  We did not see any evidence of that on the MRI obtained on April 4.  However his pain is only been going on that lateral foot for about 2 weeks now so it may not be long enough to really develop a stress reaction at this time.  Meantime we will use meloxicam.   PDMP not  reviewed this encounter. Orders Placed This Encounter  Procedures   Ambulatory referral to Podiatry    Referral Priority:   Routine    Referral Type:   Consultation    Referral Reason:   Specialty Services Required    Requested Specialty:   Podiatry    Number of Visits Requested:   1   Meds ordered this encounter  Medications   meloxicam (MOBIC) 15 MG tablet    Sig: Take 1 tablet (15 mg total) by mouth daily as needed for pain.    Dispense:  30 tablet    Refill:  0     Discussed warning signs or symptoms. Please see discharge instructions. Patient expresses understanding.   The above documentation has been reviewed and is accurate and complete Clementeen Graham, M.D.

## 2022-08-04 ENCOUNTER — Ambulatory Visit (INDEPENDENT_AMBULATORY_CARE_PROVIDER_SITE_OTHER): Payer: No Typology Code available for payment source | Admitting: Family Medicine

## 2022-08-04 VITALS — BP 146/92 | HR 95 | Ht 65.0 in | Wt 189.0 lb

## 2022-08-04 DIAGNOSIS — R2242 Localized swelling, mass and lump, left lower limb: Secondary | ICD-10-CM

## 2022-08-04 DIAGNOSIS — M722 Plantar fascial fibromatosis: Secondary | ICD-10-CM | POA: Insufficient documentation

## 2022-08-04 MED ORDER — MELOXICAM 15 MG PO TABS: 15.0000 mg | ORAL_TABLET | Freq: Every day | ORAL | 0 refills | Status: DC | PRN

## 2022-08-04 NOTE — Patient Instructions (Signed)
Thank you for coming in today.   Plan to follow up with podiatry about surgical options for your foot.   Use melxociam daily as needed for foot pain.   Let mw know if you have any problems.

## 2022-08-18 ENCOUNTER — Ambulatory Visit (INDEPENDENT_AMBULATORY_CARE_PROVIDER_SITE_OTHER): Payer: No Typology Code available for payment source | Admitting: Podiatry

## 2022-08-18 DIAGNOSIS — M722 Plantar fascial fibromatosis: Secondary | ICD-10-CM | POA: Diagnosis not present

## 2022-08-18 MED ORDER — MELOXICAM 15 MG PO TABS
15.0000 mg | ORAL_TABLET | Freq: Every day | ORAL | 0 refills | Status: DC
Start: 2022-08-18 — End: 2022-12-08

## 2022-08-18 NOTE — Progress Notes (Signed)
  Subjective:  Patient ID: Thomas Mcgee, male    DOB: 1970/10/16,  MRN: [id number]  Chief Complaint  Patient presents with   Plantar Fasciitis    Had a MRI done and was referred here.  No pain at the moment.    52 y.o. male presents with concern for possible plantar fibroma in the left foot.  He was referred by his primary care doctor.  MRI was completed earlier this month and there is concern for plantar fibroma.  He says that the area does cause him some pain sometimes when he steps on it.  He also notices pain on the outside of his foot because he thinks he is walking with more pressure on the outside of his foot related to the mass.  Has not had any treatment yet for this issue.  No past medical history on file.  No Known Allergies  ROS: Negative except as per HPI above  Objective:  General: AAO x3, NAD  Dermatological: Palpable mass subcutaneously with firm substance at the plantar aspect first metatarsal neck area along the plantar fascia.  Consistent with plantar fibroma location and size described on MRI  Vascular:  Dorsalis Pedis artery and Posterior Tibial artery pedal pulses are 2/4 bilateral.  Capillary fill time < 3 sec to all digits.   Neruologic: Grossly intact via light touch bilateral. Protective threshold intact to all sites bilateral.   Musculoskeletal: Tenderness with palpation at the area of the mass as well as in the lateral column.  Gait: Unassisted, Nonantalgic.   No images are attached to the encounter.  MRI L foot:There is a lesion measuring 2.1 cm long by 1 cm craniocaudal by 0.8 cm transverse in the subcutaneous tissues along the plantar aspect proximal diaphysis of the first metatarsal. The lesion is T1 and T2 hypointense and demonstrates heterogeneous enhancement. It emanates from the plantar fascia.  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  # Plantar  fibroma left foot -Based on MRI and clinical findings I discussed the patient does likely have a solitary plantar fibroma in the distal plantar fascia of the left foot. -Discussed conservative or surgical treatment options.  Discussed the risks benefits and alternatives of surgical correction as well as risk of recurrence if the lesion is excised. -I also discussed conservative measures will be monitoring versus steroid injection to decrease pain in the area. -Patient wants to try steroid injection at this time.  After sterile alcohol prep I injected 1 cc half percent Marcaine plain with 1 cc Kenalog 10 about the area of the plantar fascia in the plantar fibroma in the left plantar forefoot medially.  Patient tolerated well adhesive bandage was applied. -Will also send meloxicam 15 mg take once daily as needed for pain over the next 30 days. -Patient will follow up follow-up in 30 days to see how the injection and the meloxicam worked for pain control if not improved we will have to discuss further options  Return in about 4 weeks (around 09/15/2022) for Follow-up left foot plantar fibroma.          Corinna Gab, DPM [facility] / [facility]

## 2022-09-03 ENCOUNTER — Other Ambulatory Visit: Payer: Self-pay | Admitting: Internal Medicine

## 2022-09-05 ENCOUNTER — Other Ambulatory Visit: Payer: Self-pay | Admitting: Internal Medicine

## 2022-10-26 ENCOUNTER — Other Ambulatory Visit: Payer: Self-pay | Admitting: Internal Medicine

## 2022-11-17 ENCOUNTER — Other Ambulatory Visit: Payer: Self-pay | Admitting: Internal Medicine

## 2022-11-17 ENCOUNTER — Telehealth: Payer: Self-pay | Admitting: Internal Medicine

## 2022-11-17 MED ORDER — AMLODIPINE BESYLATE 10 MG PO TABS: ORAL_TABLET | ORAL | 0 refills | Status: DC

## 2022-11-17 NOTE — Telephone Encounter (Signed)
Prescription Request  11/17/2022  LOV: 03/31/2022  What is the name of the medication or equipment? amlodopine  Have you contacted your pharmacy to request a refill? Yes   Which pharmacy would you like this sent to?  [id number] - [place], [place] - [address] AT [facility] [address] [place] [address] Phone: [phone] Fax: [phone]    Patient notified that their request is being sent to the clinical staff for review and that they should receive a response within 2 business days.   Please advise at Mobile [phone] (mobile)

## 2022-11-17 NOTE — Telephone Encounter (Signed)
Called pt inform him a 30 day has been called to the pharmacy bcz he is overdue for appt. Pt made appt for 12/08/22.Marland KitchenRaechel Chute

## 2022-11-23 ENCOUNTER — Encounter (INDEPENDENT_AMBULATORY_CARE_PROVIDER_SITE_OTHER): Payer: Self-pay

## 2022-12-08 ENCOUNTER — Encounter: Payer: Self-pay | Admitting: Internal Medicine

## 2022-12-08 ENCOUNTER — Ambulatory Visit: Payer: No Typology Code available for payment source | Admitting: Internal Medicine

## 2022-12-08 VITALS — BP 120/70 | HR 81 | Temp 98.3°F | Ht 65.0 in | Wt 185.0 lb

## 2022-12-08 DIAGNOSIS — L509 Urticaria, unspecified: Secondary | ICD-10-CM | POA: Diagnosis not present

## 2022-12-08 DIAGNOSIS — Z1211 Encounter for screening for malignant neoplasm of colon: Secondary | ICD-10-CM

## 2022-12-08 DIAGNOSIS — S39012A Strain of muscle, fascia and tendon of lower back, initial encounter: Secondary | ICD-10-CM | POA: Diagnosis not present

## 2022-12-08 DIAGNOSIS — Z0000 Encounter for general adult medical examination without abnormal findings: Secondary | ICD-10-CM

## 2022-12-08 DIAGNOSIS — I10 Essential (primary) hypertension: Secondary | ICD-10-CM

## 2022-12-08 DIAGNOSIS — R944 Abnormal results of kidney function studies: Secondary | ICD-10-CM

## 2022-12-08 DIAGNOSIS — N320 Bladder-neck obstruction: Secondary | ICD-10-CM | POA: Insufficient documentation

## 2022-12-08 DIAGNOSIS — R739 Hyperglycemia, unspecified: Secondary | ICD-10-CM

## 2022-12-08 LAB — CBC WITH DIFFERENTIAL/PLATELET
Basophils Absolute: 0.1 10*3/uL (ref 0.0–0.1)
Basophils Relative: 1.1 % (ref 0.0–3.0)
Eosinophils Absolute: 0.2 10*3/uL (ref 0.0–0.7)
Eosinophils Relative: 4.8 % (ref 0.0–5.0)
HCT: 44.3 % (ref 39.0–52.0)
Hemoglobin: 14.7 g/dL (ref 13.0–17.0)
Lymphocytes Relative: 34.8 % (ref 12.0–46.0)
Lymphs Abs: 1.6 10*3/uL (ref 0.7–4.0)
MCHC: 33.1 g/dL (ref 30.0–36.0)
MCV: 90.3 fl (ref 78.0–100.0)
Monocytes Absolute: 0.2 10*3/uL (ref 0.1–1.0)
Monocytes Relative: 5 % (ref 3.0–12.0)
Neutro Abs: 2.6 10*3/uL (ref 1.4–7.7)
Neutrophils Relative %: 54.3 % (ref 43.0–77.0)
Platelets: 222 10*3/uL (ref 150.0–400.0)
RBC: 4.91 Mil/uL (ref 4.22–5.81)
RDW: 14.4 % (ref 11.5–15.5)
WBC: 4.7 10*3/uL (ref 4.0–10.5)

## 2022-12-08 LAB — URINALYSIS
Bilirubin Urine: NEGATIVE
Hgb urine dipstick: NEGATIVE
Ketones, ur: NEGATIVE
Leukocytes,Ua: NEGATIVE
Nitrite: NEGATIVE
Specific Gravity, Urine: 1.015 (ref 1.000–1.030)
Total Protein, Urine: NEGATIVE
Urine Glucose: NEGATIVE
Urobilinogen, UA: 1 (ref 0.0–1.0)
pH: 6 (ref 5.0–8.0)

## 2022-12-08 LAB — COMPREHENSIVE METABOLIC PANEL
ALT: 10 U/L (ref 0–53)
AST: 18 U/L (ref 0–37)
Albumin: 4.6 g/dL (ref 3.5–5.2)
Alkaline Phosphatase: 55 U/L (ref 39–117)
BUN: 17 mg/dL (ref 6–23)
CO2: 29 mEq/L (ref 19–32)
Calcium: 9.4 mg/dL (ref 8.4–10.5)
Chloride: 102 mEq/L (ref 96–112)
Creatinine, Ser: 1.4 mg/dL (ref 0.40–1.50)
GFR: 58.22 mL/min — ABNORMAL LOW (ref >60.00)
Glucose, Bld: 95 mg/dL (ref 70–99)
Potassium: 4 mEq/L (ref 3.5–5.1)
Sodium: 137 mEq/L (ref 135–145)
Total Bilirubin: 0.7 mg/dL (ref 0.2–1.2)
Total Protein: 7.2 g/dL (ref 6.0–8.3)

## 2022-12-08 LAB — TSH: TSH: 1.65 u[IU]/mL (ref 0.35–5.50)

## 2022-12-08 LAB — LIPID PANEL
Cholesterol: 160 mg/dL (ref 0–200)
HDL: 28.1 mg/dL — ABNORMAL LOW (ref >39.00)
LDL Cholesterol: 104 mg/dL — ABNORMAL HIGH (ref 0–99)
NonHDL: 131.82
Total CHOL/HDL Ratio: 6
Triglycerides: 141 mg/dL (ref 0.0–149.0)
VLDL: 28.2 mg/dL (ref 0.0–40.0)

## 2022-12-08 LAB — HEMOGLOBIN A1C: Hgb A1c MFr Bld: 5.7 % (ref 4.6–6.5)

## 2022-12-08 LAB — PSA: PSA: 1.62 ng/mL (ref 0.10–4.00)

## 2022-12-08 MED ORDER — FEXOFENADINE HCL 180 MG PO TABS: 180.0000 mg | ORAL_TABLET | Freq: Every day | ORAL | 2 refills | Status: DC

## 2022-12-08 MED ORDER — NAPROXEN 500 MG PO TABS: 500.0000 mg | ORAL_TABLET | Freq: Two times a day (BID) | ORAL | 1 refills | Status: DC | PRN

## 2022-12-08 MED ORDER — FINASTERIDE 5 MG PO TABS: 5.0000 mg | ORAL_TABLET | Freq: Every day | ORAL | 3 refills | Status: AC

## 2022-12-08 MED ORDER — TRIAMCINOLONE ACETONIDE 0.1 % EX CREA: 1.0000 | TOPICAL_CREAM | Freq: Two times a day (BID) | CUTANEOUS | 3 refills | Status: DC

## 2022-12-08 NOTE — Progress Notes (Signed)
Subjective:  Patient ID: Thomas Mcgee, male    DOB: 1970-11-04  Age: 52 y.o. MRN: [id number]  CC: Annual Exam   HPI Ehitan Griego presents for a well exam C/o urinary frequency Nocturia x1  C/o hives  in the shower x 2 mo ago  Outpatient Medications Prior to Visit  Medication Sig Dispense Refill   amLODipine (NORVASC) 10 MG tablet TAKE 1 TABLET BY MOUTH EVERY DAY. Overdue for Annual appt must see provider for future refills 30 tablet 0   fluticasone (FLONASE) 50 MCG/ACT nasal spray Place 2 sprays into both nostrils daily. 16 g 6   loratadine (CLARITIN) 10 MG tablet Take 1 tablet (10 mg total) by mouth daily. 90 tablet 1   meloxicam (MOBIC) 15 MG tablet Take 1 tablet (15 mg total) by mouth daily as needed for pain. 30 tablet 0   meloxicam (MOBIC) 15 MG tablet Take 1 tablet (15 mg total) by mouth daily. 30 tablet 0   atovaquone-proguanil (MALARONE) 250-100 MG TABS tablet 1 po qd - start 2 d before travel, take daily while in [place] and x 1 wk after return. Then stop (Patient not taking: Reported on 03/31/2022) 68 tablet 1   Cholecalciferol (VITAMIN D3) 50 MCG (2000 UT) capsule Take 1 capsule (2,000 Units total) by mouth daily. (Patient not taking: Reported on 02/17/2022) 100 capsule 3   No facility-administered medications prior to visit.    ROS: Review of Systems  Constitutional:  Negative for appetite change, fatigue and unexpected weight change.  HENT:  Negative for congestion, nosebleeds, sneezing, sore throat and trouble swallowing.   Eyes:  Negative for itching and visual disturbance.  Respiratory:  Negative for cough.   Cardiovascular:  Negative for chest pain, palpitations and leg swelling.  Gastrointestinal:  Negative for abdominal distention, blood in stool, diarrhea and nausea.  Genitourinary:  Negative for frequency and hematuria.  Musculoskeletal:  Positive for back pain and gait problem. Negative for joint swelling and neck pain.  Skin:  Positive for rash.   Neurological:  Negative for dizziness, tremors, speech difficulty and weakness.  Psychiatric/Behavioral:  Negative for agitation, dysphoric mood, sleep disturbance and suicidal ideas. The patient is not nervous/anxious.     Objective:  BP 120/70 (BP Location: Left Arm, Patient Position: Sitting, Cuff Size: Large)   Pulse 81   Temp 98.3 F (36.8 C) (Oral)   Ht 5\' 5"  (1.651 m)   Wt 185 lb (83.9 kg)   SpO2 96%   BMI 30.79 kg/m   BP Readings from Last 3 Encounters:  12/08/22 120/70  08/04/22 (!) 146/92  03/31/22 110/72    Wt Readings from Last 3 Encounters:  12/08/22 185 lb (83.9 kg)  08/04/22 189 lb (85.7 kg)  03/31/22 185 lb (83.9 kg)    Physical Exam Constitutional:      General: He is not in acute distress.    Appearance: He is well-developed.     Comments: NAD  Eyes:     Conjunctiva/sclera: Conjunctivae normal.     Pupils: Pupils are equal, round, and reactive to light.  Neck:     Thyroid: No thyromegaly.     Vascular: No JVD.  Cardiovascular:     Rate and Rhythm: Normal rate and regular rhythm.     Heart sounds: Normal heart sounds. No murmur heard.    No friction rub. No gallop.  Pulmonary:     Effort: Pulmonary effort is normal. No respiratory distress.     Breath sounds: Normal breath sounds. No  wheezing or rales.  Chest:     Chest wall: No tenderness.  Abdominal:     General: Bowel sounds are normal. There is no distension.     Palpations: Abdomen is soft. There is no mass.     Tenderness: There is no abdominal tenderness. There is no guarding or rebound.  Musculoskeletal:        General: Tenderness present. Normal range of motion.     Cervical back: Normal range of motion.  Lymphadenopathy:     Cervical: No cervical adenopathy.  Skin:    General: Skin is warm and dry.     Findings: No rash.  Neurological:     Mental Status: He is alert and oriented to person, place, and time.     Cranial Nerves: No cranial nerve deficit.     Motor: No abnormal  muscle tone.     Coordination: Coordination normal.     Gait: Gait abnormal.     Deep Tendon Reflexes: Reflexes are normal and symmetric.  Psychiatric:        Behavior: Behavior normal.        Thought Content: Thought content normal.        Judgment: Judgment normal.   Antalgic gait Left from paraspinal muscles are not tender to palpation, pain is worse with range of motion Straight leg elevation is negative bilaterally  Hives on phone pictures  I spent 22 minutes in addition to time for CPX wellness examination in preparing to see the patient by review of recent labs, imaging and procedures, obtaining and reviewing separately obtained history, communicating with the patient, ordering medications, tests or procedures, and documenting clinical information in the EHR including the differential diagnosis, treatment, and any further evaluation and other management of rash, LBP        Lab Results  Component Value Date   WBC 5.1 11/27/2020   HGB 14.6 11/27/2020   HCT 44.0 11/27/2020   PLT 226.0 11/27/2020   GLUCOSE 94 06/10/2021   CHOL 152 11/27/2020   TRIG 94.0 11/27/2020   HDL 31.70 (L) 11/27/2020   LDLCALC 102 (H) 11/27/2020   ALT 10 06/10/2021   AST 18 06/10/2021   NA 138 06/10/2021   K 3.8 06/10/2021   CL 102 06/10/2021   CREATININE 1.52 (H) 06/10/2021   BUN 18 06/10/2021   CO2 33 (H) 06/10/2021   TSH 0.64 02/18/2021   PSA 1.22 11/27/2020   HGBA1C 5.9 11/27/2020    MR FOOT LEFT W WO CONTRAST  Result Date: 07/31/2022 CLINICAL DATA:  Soft tissue mass on the plantar aspect of the left foot. EXAM: MRI OF THE LEFT FOREFOOT WITHOUT AND WITH CONTRAST TECHNIQUE: Multiplanar, multisequence MR imaging of the left foot was performed both before and after administration of intravenous contrast. CONTRAST:  9 mL Vueway IV. COMPARISON:  None Available. FINDINGS: Bones/Joint/Cartilage No fracture, stress change or worrisome lesion is identified. No joint effusion. No evidence of  arthropathy. Ligaments Intact. Muscles and Tendons Intact. Soft tissues There is a lesion measuring 2.1 cm long by 1 cm craniocaudal by 0.8 cm transverse in the subcutaneous tissues along the plantar aspect proximal diaphysis of the first metatarsal. The lesion is T1 and T2 hypointense and demonstrates heterogeneous enhancement. It emanates from the plantar fascia. 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 Electronically Signed   By: Drusilla Kanner M.D.   On: 07/31/2022 08:25    Assessment & Plan:  Problem List Items Addressed This Visit     Well adult exam - Primary     We discussed age appropriate health related issues, including available/recomended screening tests and vaccinations. Labs were ordered to be later reviewed . All questions were answered. We discussed one or more of the following - seat belt use, use of sunscreen/sun exposure exercise, fall risk reduction, second hand smoke exposure, firearm use and storage, seat belt use, a need for adhering to healthy diet and exercise. Labs were ordered.  All questions were answered. Will refer to GI to have a colonoscopy done.      Relevant Orders   TSH   Urinalysis   CBC with Differential/Platelet   Lipid panel   PSA   Comprehensive metabolic panel   Hyperglycemia    Check A1c      Relevant Orders   Hemoglobin A1c   HTN (hypertension)    Continue with amlodipine daily      Decreased GFR    Monitor GFR      Low back strain    [place].  L side musculoskeletal strain Use heat Naproxen po twice daily Blue-Emu cream was recommended to use 2-3 times a day No signs of sciatica.       Bladder neck obstruction    Worse Start Finasteride daily       Urticaria    New.  Unclear etiology.  Potential allergens discussed.  Up to 2-3 episodes per week for the last 2 months.  Start Allegra 180 was daily Triamc cream 0.1% 3 times daily  as needed      Other Visit Diagnoses     Screening for colon cancer       Relevant Orders   Ambulatory referral to Gastroenterology         Meds ordered this encounter  Medications   fexofenadine (ALLEGRA) 180 MG tablet    Sig: Take 1 tablet (180 mg total) by mouth daily.    Dispense:  90 tablet    Refill:  2   triamcinolone cream (KENALOG) 0.1 %    Sig: Apply 1 Application topically 2 (two) times daily.    Dispense:  450 g    Refill:  3   naproxen (NAPROSYN) 500 MG tablet    Sig: Take 1 tablet (500 mg total) by mouth 2 (two) times daily as needed for moderate pain.    Dispense:  60 tablet    Refill:  1   finasteride (PROSCAR) 5 MG tablet    Sig: Take 1 tablet (5 mg total) by mouth daily.    Dispense:  90 tablet    Refill:  3      Follow-up: Return in about 2 months (around 02/07/2023) for a follow-up visit.  Sonda Primes, MD

## 2022-12-08 NOTE — Assessment & Plan Note (Addendum)
Worse Start Finasteride daily

## 2022-12-08 NOTE — Assessment & Plan Note (Signed)
Check A1c. 

## 2022-12-08 NOTE — Assessment & Plan Note (Addendum)
New.  L side musculoskeletal strain Use heat Naproxen po twice daily Blue-Emu cream was recommended to use 2-3 times a day No signs of sciatica.

## 2022-12-08 NOTE — Assessment & Plan Note (Signed)
Monitor GFR 

## 2022-12-08 NOTE — Assessment & Plan Note (Addendum)
New.  Unclear etiology.  Potential allergens discussed.  Up to 2-3 episodes per week for the last 2 months.  Start Allegra 180 was daily Triamc cream 0.1% 3 times daily as needed

## 2022-12-08 NOTE — Assessment & Plan Note (Signed)
  We discussed age appropriate health related issues, including available/recomended screening tests and vaccinations. Labs were ordered to be later reviewed . All questions were answered. We discussed one or more of the following - seat belt use, use of sunscreen/sun exposure exercise, fall risk reduction, second hand smoke exposure, firearm use and storage, seat belt use, a need for adhering to healthy diet and exercise. Labs were ordered.  All questions were answered. Will refer to GI to have a colonoscopy done.

## 2022-12-08 NOTE — Assessment & Plan Note (Signed)
Continue with amlodipine daily

## 2022-12-10 IMAGING — DX DG LUMBAR SPINE 2-3V
3 series · 3 of 3 positions shown · non-contrast
Comparison: None.

CLINICAL DATA: Right lumbar spine pain x 2 weeks.  No injury.

EXAM:
LUMBAR SPINE - 2-3 VIEW

[l-spine ap]
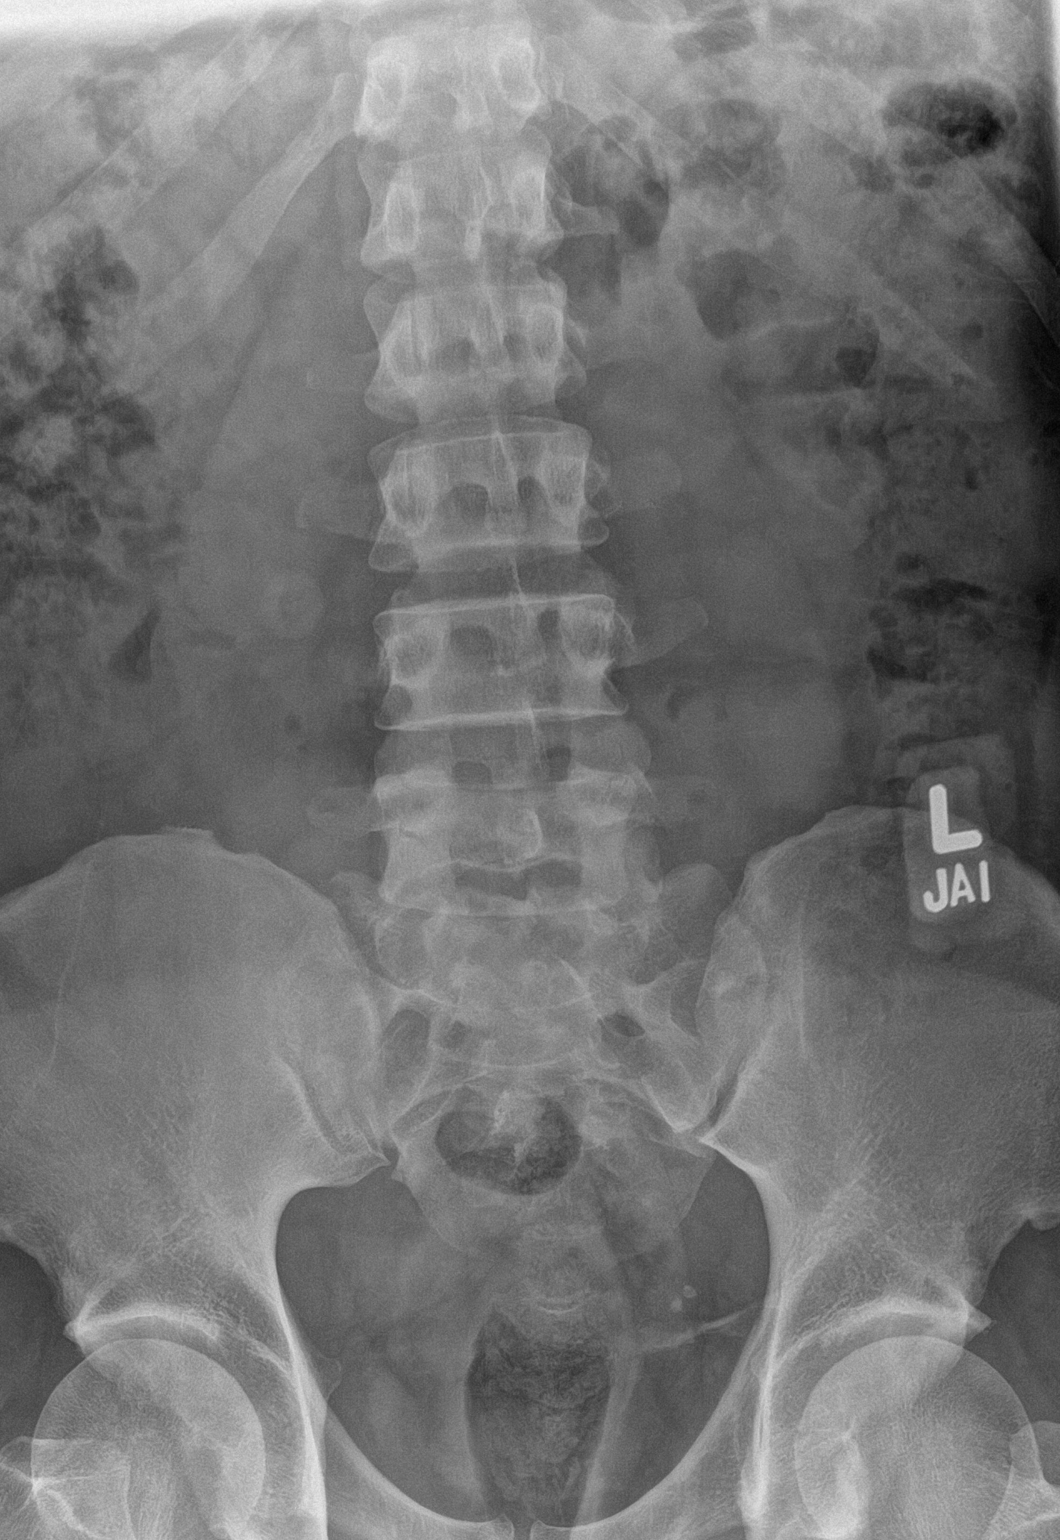

[l-spine lateral]
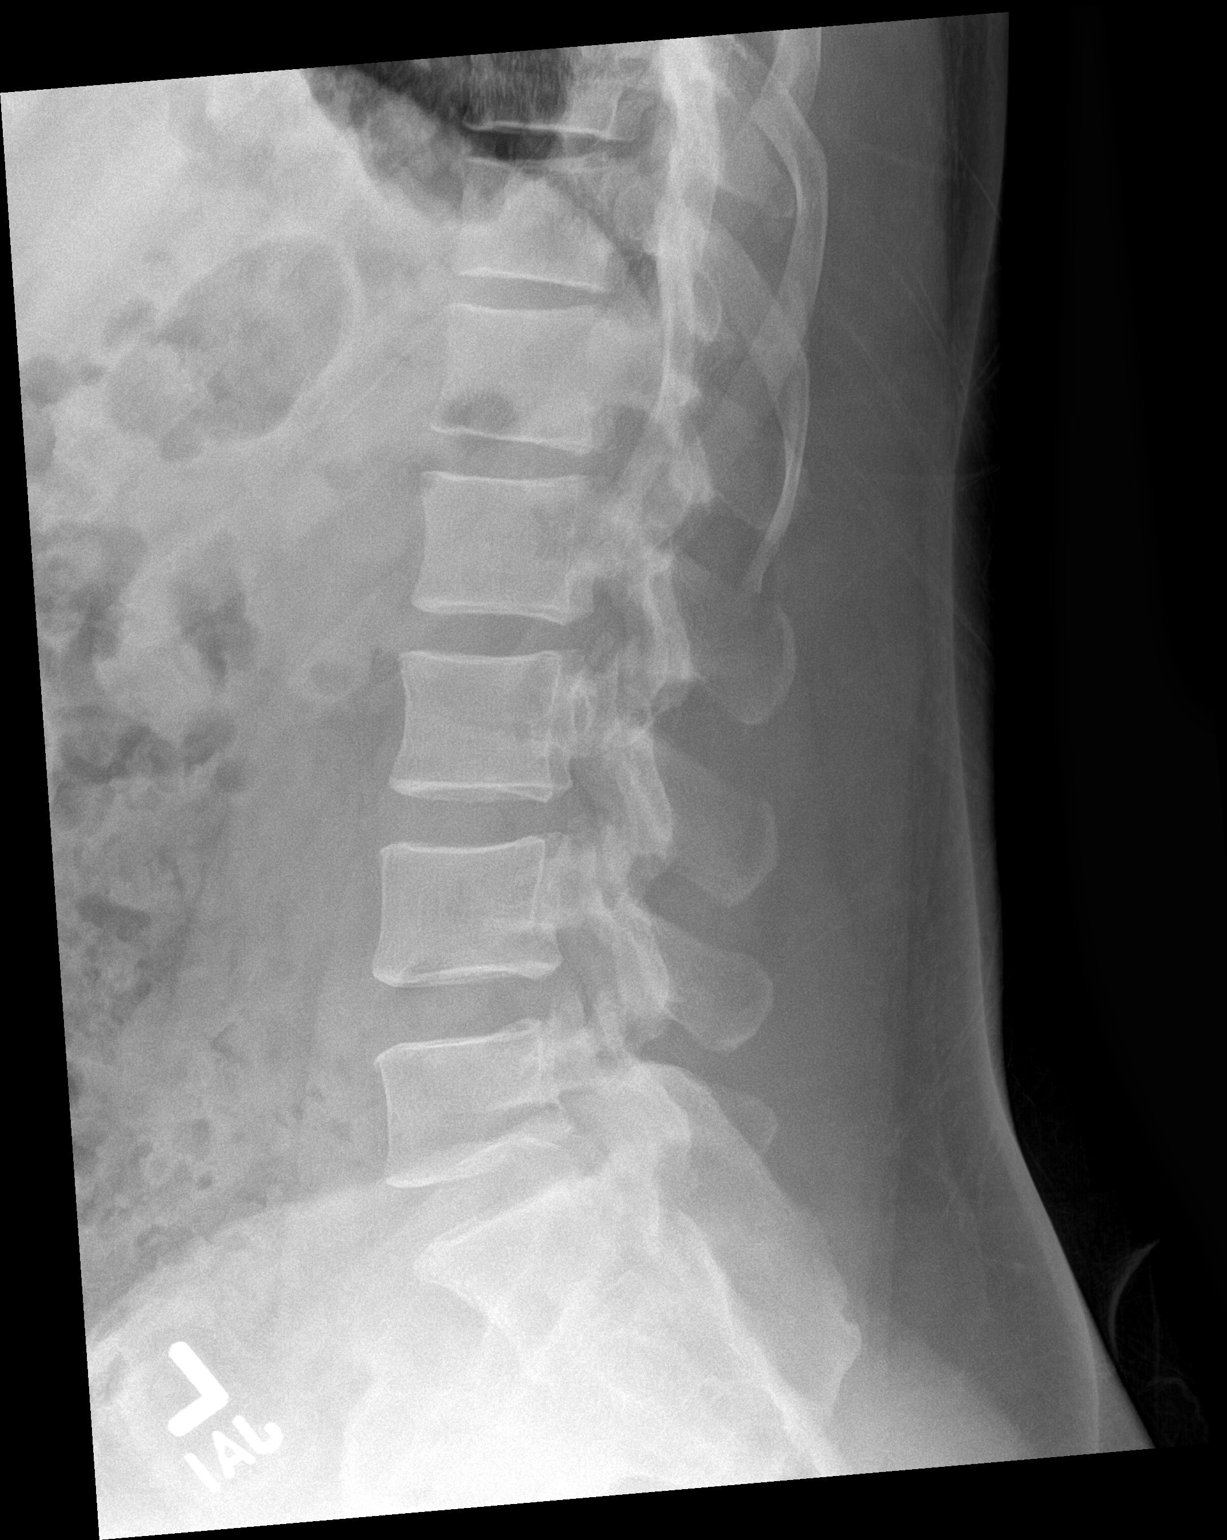

[l-spine spot]
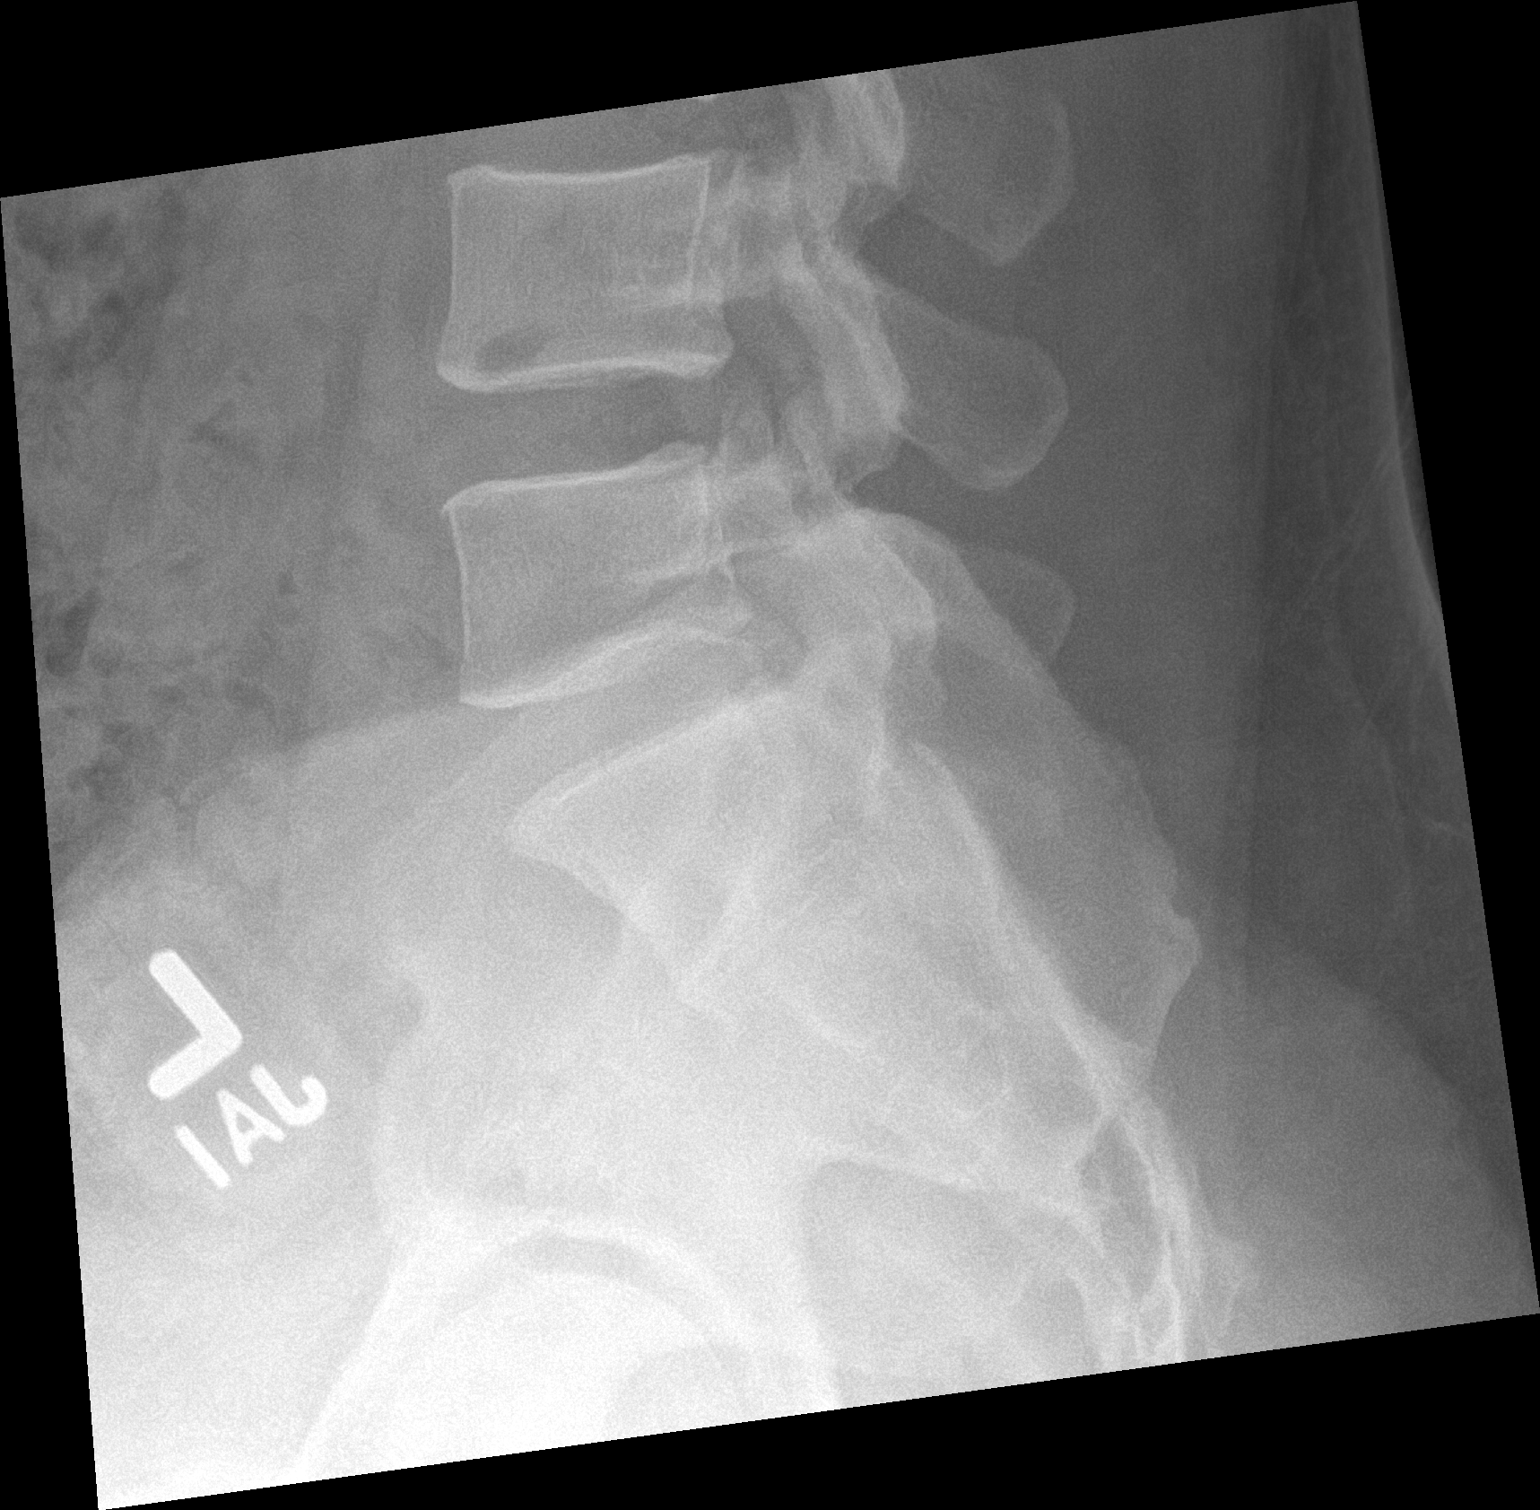

[3 of 3 positions shown; findings below may reference images not displayed]

FINDINGS: Vertebral body alignment, heights and disc space heights are normal.
Mild facet arthropathy over the lower lumbar spine. No evidence of
compression fracture or spondylolisthesis. Few small phleboliths
over the left pelvis.
IMPRESSION: No acute findings.

## 2022-12-10 IMAGING — US US RENAL
1 series · 14 of 25 positions shown · non-contrast
Comparison: None.

CLINICAL DATA: R LBP, colics - worse. R/o stones. Decreased GFR.
Hypertension

EXAM:
RENAL / URINARY TRACT ULTRASOUND COMPLETE

[Series 1: us renal · 0.18mm/px · 14 of 45 slices shown]
[im 1/45]
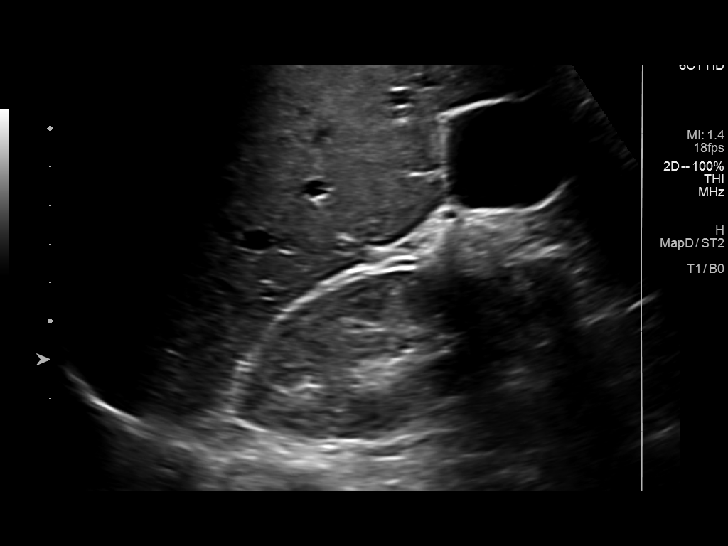
[im 4/45]
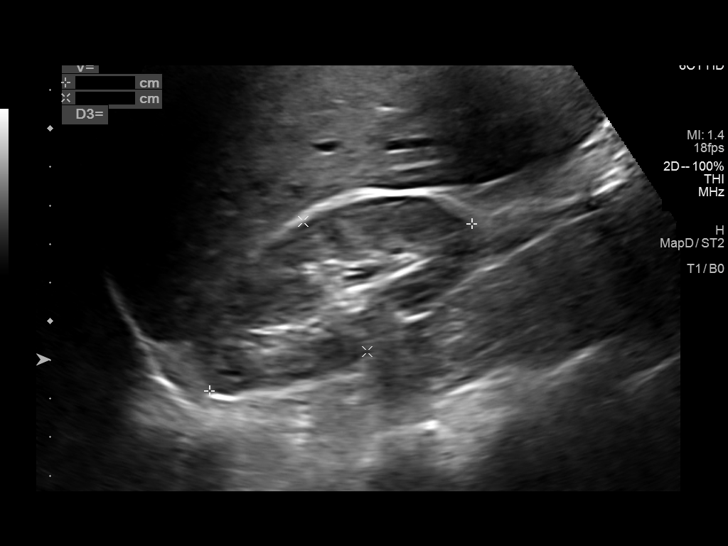
[im 8/45]
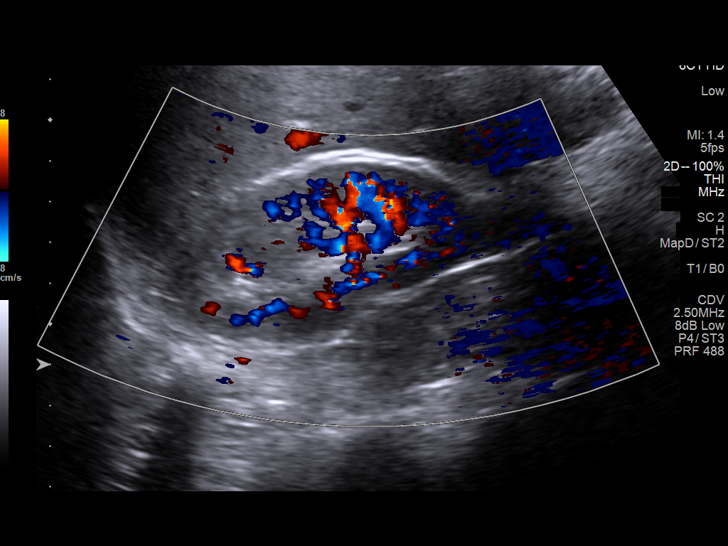
[im 12/45]
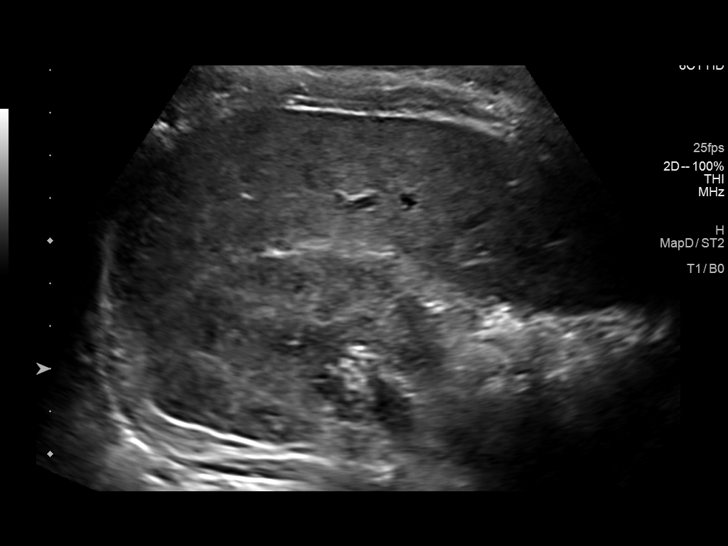
[im 15/45]
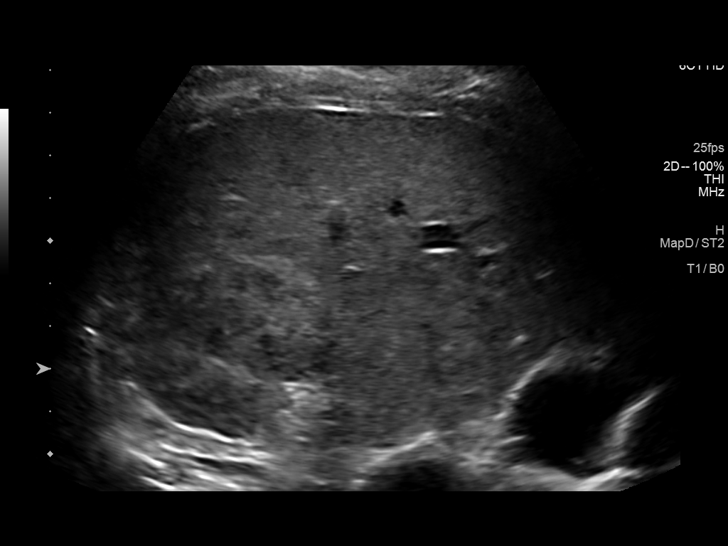
[im 17/45]
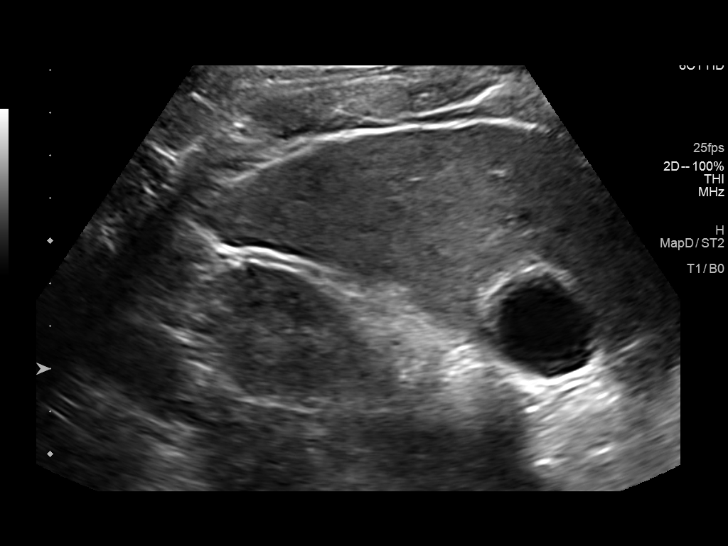
[im 21/45]
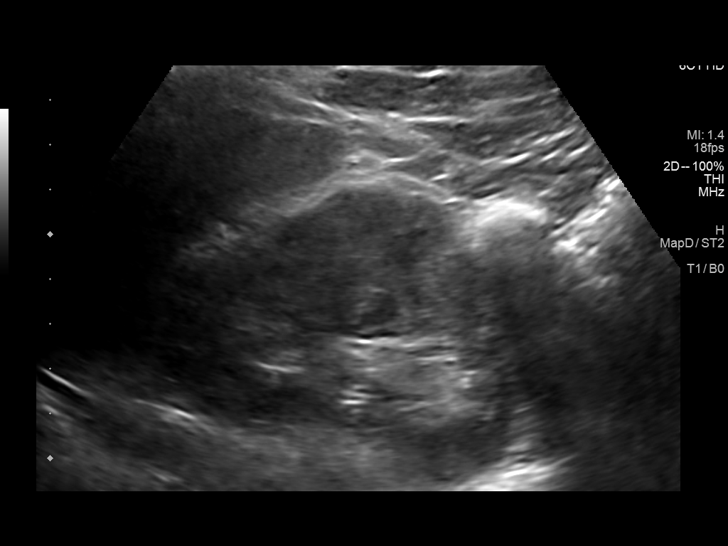
[im 24/45]
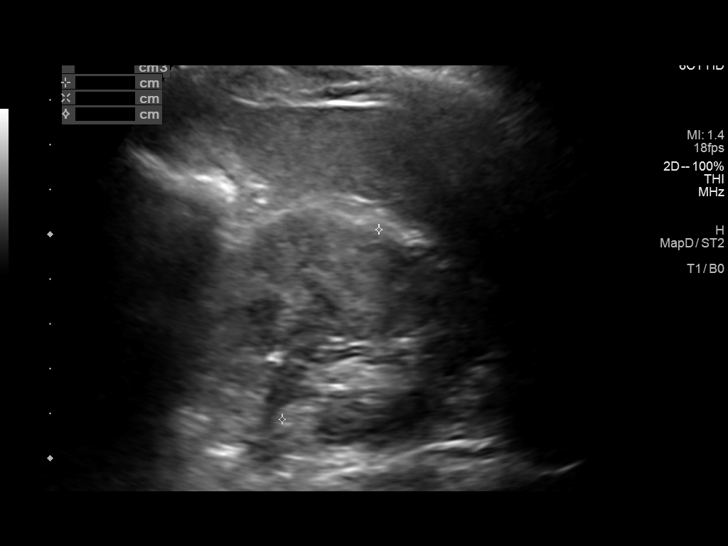
[im 28/45]
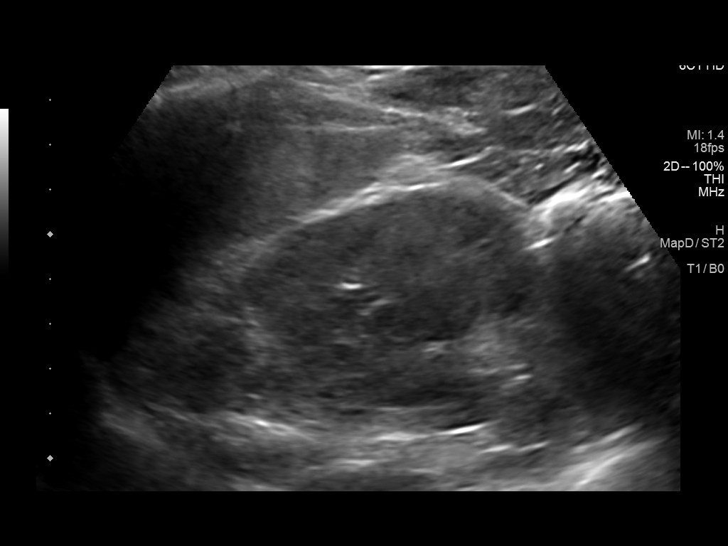
[im 30/45]
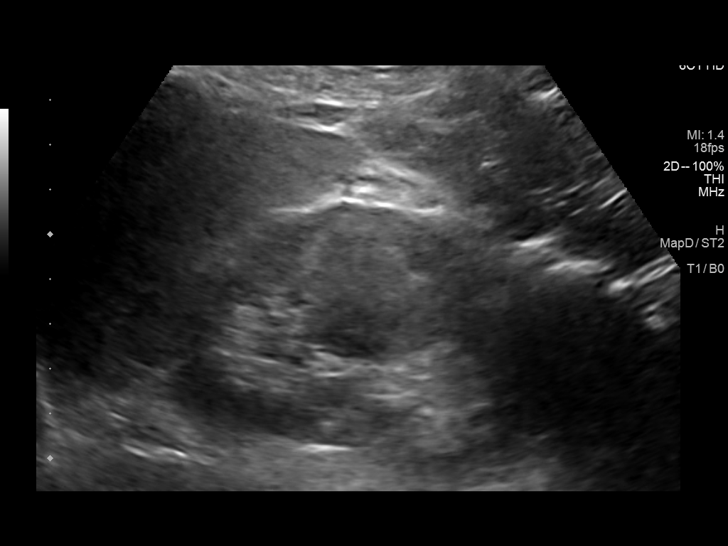
[im 34/45]
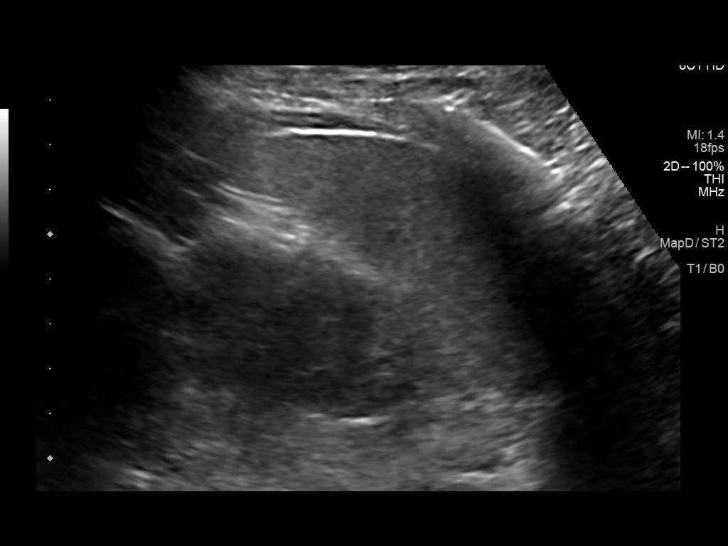
[im 37/45]
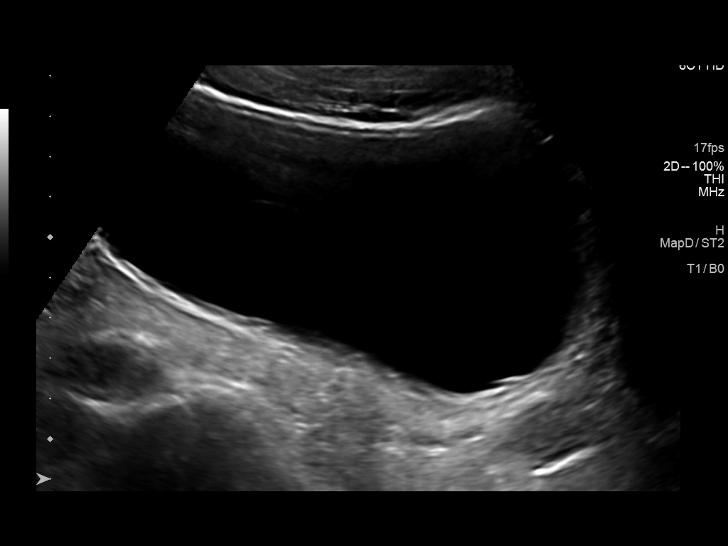
[im 41/45]
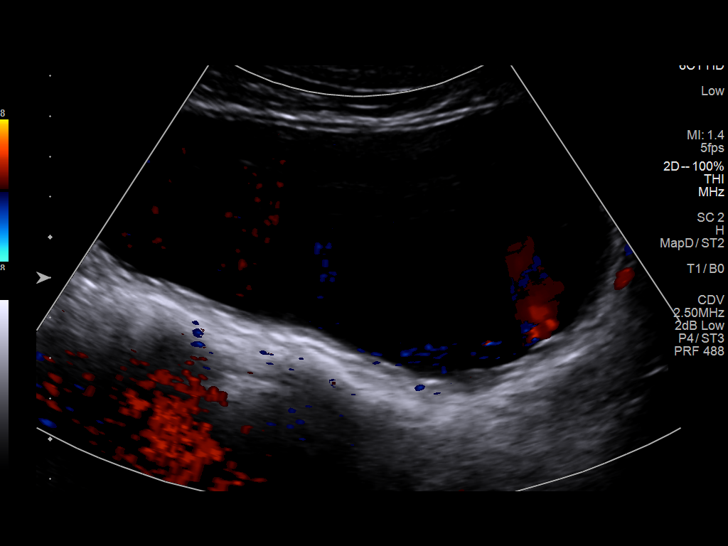
[im 45/45]
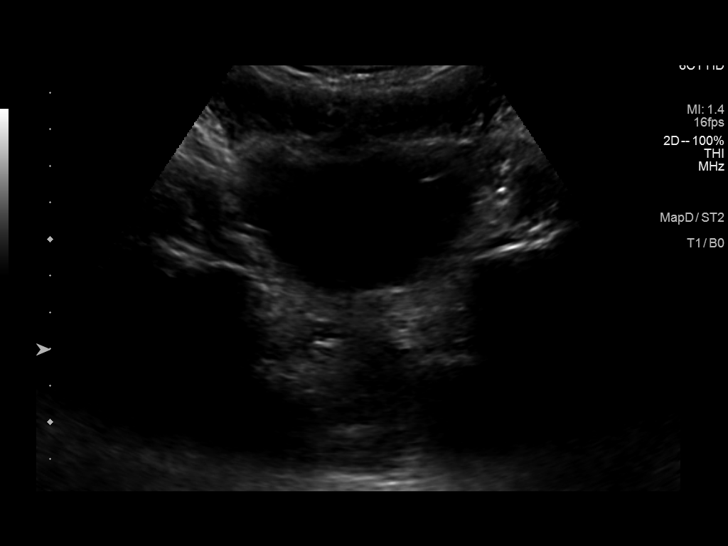

[14 of 25 positions shown; findings below may reference images not displayed]

FINDINGS: Right Kidney:

Renal measurements: 8.1 x 3.8 x 5 cm = volume: 79 mL. Echogenicity
within normal limits. No mass or hydronephrosis visualized.

Left Kidney:

Renal measurements: 8.9 x 5.1 x 4.8 cm = volume: 113 mL.
Echogenicity within normal limits. No mass or hydronephrosis
visualized.

Urinary bladder:

Appears normal for degree of bladder distention.

Other:

The prostate is prominent in size measuring up to 3.1 x 4.9 x
cm.
IMPRESSION: 1. Unremarkable renal ultrasound.
2. Prominent prostate.

## 2023-06-08 ENCOUNTER — Other Ambulatory Visit: Payer: Self-pay | Admitting: Internal Medicine

## 2023-06-08 ENCOUNTER — Encounter: Payer: Self-pay | Admitting: Internal Medicine

## 2023-06-13 MED ORDER — AMLODIPINE BESYLATE 10 MG PO TABS: ORAL_TABLET | ORAL | 0 refills | Status: DC

## 2023-06-20 ENCOUNTER — Ambulatory Visit: Payer: No Typology Code available for payment source | Admitting: Internal Medicine

## 2023-06-23 ENCOUNTER — Ambulatory Visit: Payer: No Typology Code available for payment source | Admitting: Internal Medicine

## 2023-07-17 ENCOUNTER — Ambulatory Visit (INDEPENDENT_AMBULATORY_CARE_PROVIDER_SITE_OTHER): Payer: No Typology Code available for payment source | Admitting: Internal Medicine

## 2023-07-17 VITALS — BP 124/94 | HR 72 | Ht 65.0 in | Wt 181.8 lb

## 2023-07-17 DIAGNOSIS — R3915 Urgency of urination: Secondary | ICD-10-CM

## 2023-07-17 DIAGNOSIS — R944 Abnormal results of kidney function studies: Secondary | ICD-10-CM

## 2023-07-17 DIAGNOSIS — M542 Cervicalgia: Secondary | ICD-10-CM | POA: Insufficient documentation

## 2023-07-17 DIAGNOSIS — J301 Allergic rhinitis due to pollen: Secondary | ICD-10-CM

## 2023-07-17 DIAGNOSIS — I10 Essential (primary) hypertension: Secondary | ICD-10-CM | POA: Diagnosis not present

## 2023-07-17 LAB — URINALYSIS
Bilirubin Urine: NEGATIVE
Hgb urine dipstick: NEGATIVE
Ketones, ur: NEGATIVE
Leukocytes,Ua: NEGATIVE
Nitrite: NEGATIVE
Specific Gravity, Urine: 1.01 (ref 1.000–1.030)
Total Protein, Urine: NEGATIVE
Urine Glucose: NEGATIVE
Urobilinogen, UA: 0.2 (ref 0.0–1.0)
pH: 7 (ref 5.0–8.0)

## 2023-07-17 LAB — COMPREHENSIVE METABOLIC PANEL
ALT: 9 U/L (ref 0–53)
AST: 17 U/L (ref 0–37)
Albumin: 4.5 g/dL (ref 3.5–5.2)
Alkaline Phosphatase: 49 U/L (ref 39–117)
BUN: 12 mg/dL (ref 6–23)
CO2: 31 meq/L (ref 19–32)
Calcium: 9.7 mg/dL (ref 8.4–10.5)
Chloride: 103 meq/L (ref 96–112)
Creatinine, Ser: 1.36 mg/dL (ref 0.40–1.50)
GFR: 60.02 mL/min (ref >60.00)
Glucose, Bld: 99 mg/dL (ref 70–99)
Potassium: 4.2 meq/L (ref 3.5–5.1)
Sodium: 140 meq/L (ref 135–145)
Total Bilirubin: 0.6 mg/dL (ref 0.2–1.2)
Total Protein: 7.1 g/dL (ref 6.0–8.3)

## 2023-07-17 LAB — PSA: PSA: 1.75 ng/mL (ref 0.10–4.00)

## 2023-07-17 MED ORDER — AZITHROMYCIN 250 MG PO TABS: ORAL_TABLET | ORAL | 0 refills | Status: DC

## 2023-07-17 MED ORDER — FEXOFENADINE-PSEUDOEPHED ER 60-120 MG PO TB12: 1.0000 | ORAL_TABLET | Freq: Two times a day (BID) | ORAL | 3 refills | Status: DC | PRN

## 2023-07-17 MED ORDER — MELOXICAM 7.5 MG PO TABS: 7.5000 mg | ORAL_TABLET | Freq: Every day | ORAL | 0 refills | Status: DC

## 2023-07-17 MED ORDER — NAPROXEN 500 MG PO TABS: 500.0000 mg | ORAL_TABLET | Freq: Two times a day (BID) | ORAL | 1 refills | Status: DC | PRN

## 2023-07-17 MED ORDER — AMLODIPINE BESYLATE 10 MG PO TABS: ORAL_TABLET | ORAL | 3 refills | Status: DC

## 2023-07-17 NOTE — Assessment & Plan Note (Signed)
Monitor GFR Hydrate well 

## 2023-07-17 NOTE — Assessment & Plan Note (Signed)
Continue with amlodipine daily

## 2023-07-17 NOTE — Assessment & Plan Note (Signed)
 L side - MSK Meloxicam Heat

## 2023-07-17 NOTE — Patient Instructions (Signed)
 USEFUL THINGS FOR ARTHRITIS and musculoskeletal pains:    A "rice sock heating pad" refers to a homemade heating pad created by filling a sock with uncooked rice, which can be heated in a microwave to provide a warm compress for sore muscles, pain relief, or other applications; essentially, it's a simple way to generate heat using readily available materials.  Key points about rice sock heat: How to make it: Fill a clean sock (preferably a tube sock) about 2/3 full with uncooked rice, tie a knot at the top to secure the rice inside.  Heating it up: Place the rice sock in the microwave and heat in short intervals (usually around 30 seconds at a time) until it reaches the desired warmth.  Important considerations: Check temperature before applying: Always test the temperature of the rice sock before applying it to your skin to avoid burns.  Use a towel to protect skin: Wrap the rice sock in a thin towel to distribute the heat evenly and protect your skin.  Uses: Muscle aches and pains  Menstrual cramps  Neck pain  Arthritis discomfort    BLUE EMU CREAM: Use it 2-3 times a day on painful areas    Contour pillow

## 2023-07-17 NOTE — Assessment & Plan Note (Signed)
 Allegra D 12 h prn

## 2023-07-17 NOTE — Progress Notes (Signed)
 Subjective:  Patient ID: Thomas Mcgee, male    DOB: [birth year]  Age: 53 y.o. MRN: [id number]  CC: Medical Management of Chronic Issues (Follow Up. Notes seasonal allergy flare up (streaks of blood in sputum). Pain on left side (noted left shoulder, chest). Has been sleeping on the floor to try and work out this pain. )   HPI Thomas Mcgee presents for neck pain, L shoulder  down tn to the elbow x 1 week C/o allergies F/u on HTN  Outpatient Medications Prior to Visit  Medication Sig Dispense Refill   finasteride (PROSCAR) 5 MG tablet Take 1 tablet (5 mg total) by mouth daily. 90 tablet 3   fluticasone (FLONASE) 50 MCG/ACT nasal spray Place 2 sprays into both nostrils daily. 16 g 6   triamcinolone cream (KENALOG) 0.1 % Apply 1 Application topically 2 (two) times daily. 450 g 3   amLODipine (NORVASC) 10 MG tablet TAKE 1 TABLET BY MOUTH EVERY DAY. Overdue for Annual appt must see provider for future refills 30 tablet 0   fexofenadine (ALLEGRA) 180 MG tablet Take 1 tablet (180 mg total) by mouth daily. 90 tablet 2   naproxen (NAPROSYN) 500 MG tablet Take 1 tablet (500 mg total) by mouth 2 (two) times daily as needed for moderate pain. 60 tablet 1   atovaquone-proguanil (MALARONE) 250-100 MG TABS tablet 1 po qd - start 2 d before travel, take daily while in [place] and x 1 wk after return. Then stop (Patient not taking: Reported on 07/17/2023) 68 tablet 1   Cholecalciferol (VITAMIN D3) 50 MCG (2000 UT) capsule Take 1 capsule (2,000 Units total) by mouth daily. (Patient not taking: Reported on 07/17/2023) 100 capsule 3   No facility-administered medications prior to visit.    ROS: Review of Systems  Constitutional:  Negative for appetite change, fatigue and unexpected weight change.  HENT:  Negative for congestion, nosebleeds, sneezing, sore throat and trouble swallowing.   Eyes:  Negative for itching and visual disturbance.  Respiratory:  Negative for cough.   Cardiovascular:  Negative for  chest pain, palpitations and leg swelling.  Gastrointestinal:  Negative for abdominal distention, blood in stool, diarrhea and nausea.  Genitourinary:  Negative for frequency and hematuria.  Musculoskeletal:  Positive for neck pain. Negative for back pain, gait problem and joint swelling.  Skin:  Negative for rash.  Neurological:  Negative for dizziness, tremors, speech difficulty and weakness.  Psychiatric/Behavioral:  Negative for agitation, dysphoric mood and sleep disturbance. The patient is not nervous/anxious.     Objective:  BP (!) 124/94   Pulse 72   Ht 5\' 5"  (1.651 m)   Wt 181 lb 12.8 oz (82.5 kg)   SpO2 97%   BMI 30.25 kg/m   BP Readings from Last 3 Encounters:  07/17/23 (!) 124/94  12/08/22 120/70  08/04/22 (!) 146/92    Wt Readings from Last 3 Encounters:  07/17/23 181 lb 12.8 oz (82.5 kg)  12/08/22 185 lb (83.9 kg)  08/04/22 189 lb (85.7 kg)    Physical Exam Constitutional:      General: He is not in acute distress.    Appearance: He is well-developed.     Comments: NAD  Eyes:     Conjunctiva/sclera: Conjunctivae normal.     Pupils: Pupils are equal, round, and reactive to light.  Neck:     Thyroid: No thyromegaly.     Vascular: No JVD.  Cardiovascular:     Rate and Rhythm: Normal rate and regular rhythm.  Heart sounds: Normal heart sounds. No murmur heard.    No friction rub. No gallop.  Pulmonary:     Effort: Pulmonary effort is normal. No respiratory distress.     Breath sounds: Normal breath sounds. No wheezing or rales.  Chest:     Chest wall: No tenderness.  Abdominal:     General: Bowel sounds are normal. There is no distension.     Palpations: Abdomen is soft. There is no mass.     Tenderness: There is no abdominal tenderness. There is no guarding or rebound.  Musculoskeletal:        General: Normal range of motion.     Cervical back: Normal range of motion. Tenderness present. No rigidity.  Lymphadenopathy:     Cervical: No cervical  adenopathy.  Skin:    General: Skin is warm and dry.     Findings: Rash present.  Neurological:     Mental Status: He is alert and oriented to person, place, and time.     Cranial Nerves: No cranial nerve deficit.     Motor: No abnormal muscle tone.     Coordination: Coordination normal.     Gait: Gait normal.     Deep Tendon Reflexes: Reflexes are normal and symmetric.  Psychiatric:        Behavior: Behavior normal.        Thought Content: Thought content normal.        Judgment: Judgment normal.   L trap is w/pain Good ROM   Lab Results  Component Value Date   WBC 4.7 12/08/2022   HGB 14.7 12/08/2022   HCT 44.3 12/08/2022   PLT 222.0 12/08/2022   GLUCOSE 99 07/17/2023   CHOL 160 12/08/2022   TRIG 141.0 12/08/2022   HDL 28.10 (L) 12/08/2022   LDLCALC 104 (H) 12/08/2022   ALT 9 07/17/2023   AST 17 07/17/2023   NA 140 07/17/2023   K 4.2 07/17/2023   CL 103 07/17/2023   CREATININE 1.36 07/17/2023   BUN 12 07/17/2023   CO2 31 07/17/2023   TSH 1.65 12/08/2022   PSA 1.75 07/17/2023   HGBA1C 5.7 12/08/2022    MR FOOT LEFT W WO CONTRAST Result Date: 07/31/2022 CLINICAL DATA:  Soft tissue mass on the plantar aspect of the left foot. EXAM: MRI OF THE LEFT FOREFOOT WITHOUT AND WITH CONTRAST TECHNIQUE: Multiplanar, multisequence MR imaging of the left foot was performed both before and after administration of intravenous contrast. CONTRAST:  9 mL Vueway IV. COMPARISON:  None Available. FINDINGS: Bones/Joint/Cartilage No fracture, stress change or worrisome lesion is identified. No joint effusion. No evidence of arthropathy. Ligaments Intact. Muscles and Tendons Intact. Soft tissues There is a lesion measuring 2.1 cm long by 1 cm craniocaudal by 0.8 cm transverse in the subcutaneous tissues along the plantar aspect proximal diaphysis of the first metatarsal. The lesion is T1 and T2 hypointense and demonstrates heterogeneous enhancement. It emanates from the plantar fascia. 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 Electronically Signed   By: Drusilla Kanner M.D.   On: 07/31/2022 08:25    Assessment & Plan:   Problem List Items Addressed This Visit     HTN (hypertension)   Continue with amlodipine daily      Relevant Medications   amLODipine (NORVASC) 10 MG tablet   Other Relevant Orders   Comprehensive metabolic panel (Completed)   Decreased GFR   Monitor GFR  Hydrate well      Allergic rhinitis due to pollen - Primary   Allegra D 12 h prn      Neck pain   L side - MSK Meloxicam Heat      Other Visit Diagnoses       Urgency of urination       Relevant Orders   Urinalysis (Completed)   Comprehensive metabolic panel (Completed)   PSA (Completed)         Meds ordered this encounter  Medications   fexofenadine-pseudoephedrine (ALLEGRA-D) 60-120 MG 12 hr tablet    Sig: Take 1 tablet by mouth 2 (two) times daily as needed.    Dispense:  60 tablet    Refill:  3   DISCONTD: naproxen (NAPROSYN) 500 MG tablet    Sig: Take 1 tablet (500 mg total) by mouth 2 (two) times daily as needed for moderate pain (pain score 4-6).    Dispense:  60 tablet    Refill:  1   azithromycin (ZITHROMAX Z-PAK) 250 MG tablet    Sig: As directed    Dispense:  6 tablet    Refill:  0   meloxicam (MOBIC) 7.5 MG tablet    Sig: Take 1 tablet (7.5 mg total) by mouth daily.    Dispense:  15 tablet    Refill:  0    correct Rx   amLODipine (NORVASC) 10 MG tablet    Sig: TAKE 1 TABLET BY MOUTH EVERY DAY. Overdue for Annual appt must see provider for future refills    Dispense:  90 tablet    Refill:  3      Follow-up: Return in about 6 weeks (around 08/28/2023) for a follow-up visit.  Sonda Primes, MD

## 2023-07-18 ENCOUNTER — Encounter: Payer: Self-pay | Admitting: Internal Medicine

## 2023-07-25 ENCOUNTER — Encounter (HOSPITAL_COMMUNITY): Payer: Self-pay | Admitting: *Deleted

## 2023-07-25 ENCOUNTER — Ambulatory Visit (HOSPITAL_COMMUNITY)
Admission: EM | Admit: 2023-07-25 | Discharge: 2023-07-25 | Disposition: A | Discharge status: Home / Self Care | Attending: Family Medicine | Admitting: Family Medicine

## 2023-07-25 ENCOUNTER — Ambulatory Visit (INDEPENDENT_AMBULATORY_CARE_PROVIDER_SITE_OTHER)

## 2023-07-25 DIAGNOSIS — M25512 Pain in left shoulder: Secondary | ICD-10-CM

## 2023-07-25 DIAGNOSIS — R0789 Other chest pain: Secondary | ICD-10-CM | POA: Diagnosis not present

## 2023-07-25 DIAGNOSIS — R079 Chest pain, unspecified: Secondary | ICD-10-CM

## 2023-07-25 DIAGNOSIS — M25522 Pain in left elbow: Secondary | ICD-10-CM

## 2023-07-25 HISTORY — DX: Essential (primary) hypertension: I10

## 2023-07-25 HISTORY — DX: Abnormal results of kidney function studies: R94.4

## 2023-07-25 MED ORDER — TIZANIDINE HCL 4 MG PO TABS: 4.0000 mg | ORAL_TABLET | Freq: Three times a day (TID) | ORAL | 0 refills | Status: DC | PRN

## 2023-07-25 NOTE — ED Triage Notes (Signed)
 Pt states he has had left shoulder and chest pain which he was seen on 07/17/2023 by his pcp which advised him it was muscle pain to do RICE. Pt states he has seen doing it but no relief. It has been 2 weeks now. He is taking no meds for this sx. This pain isnt consistent it comes and goes. He states the pain moves around his body its not always in his chest and shoulder sometimes its in his arm.   He was given mobic at that office visit with his pcp but he hasn't picked it up from his pharmacy yet states he will do that today.

## 2023-07-25 NOTE — Discharge Instructions (Signed)
 You were seen today for continued chest, arm and shoulder pain.  Your xray appears normal today.  If the radiologist reads this differently we will notify you.  I have sent out a low dose muscle relaxer to help.  Please take this at home when not driving as this may make you tired/sleepy.  Please also pick up the meloxicam that was sent in by your primary care provider.  You may continue heat/ice to the area as well.  Follow up if not improving as expected.

## 2023-07-25 NOTE — ED Provider Notes (Signed)
 [facility]    CSN: [id number] Arrival date & time: 07/25/23  0915      History   Chief Complaint Chief Complaint  Patient presents with   Shoulder Pain   Chest Pain    HPI Thomas Mcgee is a 53 y.o. male.    Shoulder Pain Chest Pain  Patient is here for left shoulder pain and chest pain.  He did see his pcp last week, thought was muscular in nature.  Recommended RICE, and rx for mobic, but still having pain.  He was unable to pick up the mobic, but thinks it is now ready.  Pain is at the left anterior chest, left shoulder and to the arm.  At time it is a sharp pain at the chest.          Past Medical History:  Diagnosis Date   Decreased GFR    Hypertension     Patient Active Problem List   Diagnosis Date Noted   Neck pain 07/17/2023   Low back strain 12/08/2022   Bladder neck obstruction 12/08/2022   Urticaria 12/08/2022   Plantar fascial fibromatosis of left foot 08/04/2022   Sinusitis 03/31/2022   Flank pain, acute 06/03/2021   Low back pain 06/03/2021   Decreased GFR 06/03/2021   Colon cancer screening 02/18/2021   Need for hepatitis C screening test 02/18/2021   Abnormal serum thyroid stimulating hormone (TSH) level 12/09/2020   Travel advice encounter 12/09/2020   HTN (hypertension) 11/29/2020   Foot pain, bilateral 11/27/2020   Well adult exam 11/27/2020   Hyperglycemia 11/27/2020   Low HDL (under 40) 09/24/2018   Allergic rhinitis due to pollen 09/24/2018    History reviewed. No pertinent surgical history.     Home Medications    Prior to Admission medications   Medication Sig Start Date End Date Taking? Authorizing Provider  amLODipine (NORVASC) 10 MG tablet TAKE 1 TABLET BY MOUTH EVERY DAY. Overdue for Annual appt must see provider for future refills 07/17/23  Yes Plotnikov, Georgina Quint, MD  atovaquone-proguanil (MALARONE) 250-100 MG TABS tablet 1 po qd - start 2 d before travel, take daily while in [place] and x 1 wk after  return. Then stop 08/23/21  Yes Plotnikov, Georgina Quint, MD  azithromycin (ZITHROMAX Z-PAK) 250 MG tablet As directed 07/17/23  Yes Plotnikov, Georgina Quint, MD  Cholecalciferol (VITAMIN D3) 50 MCG (2000 UT) capsule Take 1 capsule (2,000 Units total) by mouth daily. 06/03/21  Yes Plotnikov, Georgina Quint, MD  fexofenadine-pseudoephedrine (ALLEGRA-D) 60-120 MG 12 hr tablet Take 1 tablet by mouth 2 (two) times daily as needed. 07/17/23  Yes Plotnikov, Georgina Quint, MD  finasteride (PROSCAR) 5 MG tablet Take 1 tablet (5 mg total) by mouth daily. 12/08/22 12/08/23 Yes Plotnikov, Georgina Quint, MD  fluticasone (FLONASE) 50 MCG/ACT nasal spray Place 2 sprays into both nostrils daily. 08/23/21  Yes Plotnikov, Georgina Quint, MD  meloxicam (MOBIC) 7.5 MG tablet Take 1 tablet (7.5 mg total) by mouth daily. 07/17/23  Yes Plotnikov, Georgina Quint, MD  triamcinolone cream (KENALOG) 0.1 % Apply 1 Application topically 2 (two) times daily. 12/08/22  Yes Plotnikov, Georgina Quint, MD    Family History History reviewed. No pertinent family history.  Social History Social History   Tobacco Use   Smoking status: Never   Smokeless tobacco: Never  Vaping Use   Vaping status: Never Used  Substance Use Topics   Alcohol use: Yes   Drug use: Never     Allergies   Patient  has no known allergies.   Review of Systems Review of Systems  Constitutional: Negative.   HENT: Negative.    Cardiovascular:  Positive for chest pain.  Gastrointestinal: Negative.   Genitourinary: Negative.   Musculoskeletal:  Positive for myalgias.  Psychiatric/Behavioral: Negative.       Physical Exam Triage Vital Signs ED Triage Vitals  Encounter Vitals Group     BP 07/25/23 1002 (!) 137/99     Systolic BP Percentile --      Diastolic BP Percentile --      Pulse Rate 07/25/23 1002 74     Resp 07/25/23 1002 18     Temp 07/25/23 1002 98.2 F (36.8 C)     Temp Source 07/25/23 1002 Oral     SpO2 07/25/23 1002 97 %     Weight --      Height --      Head  Circumference --      Peak Flow --      Pain Score 07/25/23 0958 0     Pain Loc --      Pain Education --      Exclude from Growth Chart --    No data found.  Updated Vital Signs BP (!) 137/99 (BP Location: Right Arm)   Pulse 74   Temp 98.2 F (36.8 C) (Oral)   Resp 18   SpO2 97%   Visual Acuity Right Eye Distance:   Left Eye Distance:   Bilateral Distance:    Right Eye Near:   Left Eye Near:    Bilateral Near:     Physical Exam Constitutional:      General: He is not in acute distress.    Appearance: He is well-developed and normal weight. He is not ill-appearing or toxic-appearing.  Cardiovascular:     Rate and Rhythm: Normal rate and regular rhythm.     Heart sounds: Normal heart sounds.  Pulmonary:     Effort: Pulmonary effort is normal.     Breath sounds: Normal breath sounds.  Chest:     Chest wall: No tenderness.  Musculoskeletal:        General: Normal range of motion.     Cervical back: Normal range of motion and neck supple.     Comments: Slight TTP to the left upper back/neck;  full ROM of the neck without limitation;  full rom of the shoulder without pain or limitation  Neurological:     Mental Status: He is alert.      UC Treatments / Results  Labs (all labs ordered are listed, but only abnormal results are displayed) Labs Reviewed - No data to display  EKG   Radiology No results found.  Procedures Procedures (including critical care time)  Medications Ordered in UC Medications - No data to display  Initial Impression / Assessment and Plan / UC Course  I have reviewed the triage vital signs and the nursing notes.  Pertinent labs & imaging results that were available during my care of the patient were reviewed by me and considered in my medical decision making (see chart for details).   Final Clinical Impressions(s) / UC Diagnoses   Final diagnoses:  Chest pain, unspecified type  Chest wall pain  Pain in joint of left shoulder   Arthralgia of left upper arm     Discharge Instructions      You were seen today for continued chest, arm and shoulder pain.  Your xray appears normal today.  If the radiologist  reads this differently we will notify you.  I have sent out a low dose muscle relaxer to help.  Please take this at home when not driving as this may make you tired/sleepy.  Please also pick up the meloxicam that was sent in by your primary care provider.  You may continue heat/ice to the area as well.  Follow up if not improving as expected.     ED Prescriptions     Medication Sig Dispense Auth. Provider   tiZANidine (ZANAFLEX) 4 MG tablet Take 1 tablet (4 mg total) by mouth every 8 (eight) hours as needed. 30 tablet Jannifer Franklin, MD      PDMP not reviewed this encounter.   Jannifer Franklin, MD 07/25/23 1048

## 2023-11-16 ENCOUNTER — Encounter: Payer: Self-pay | Admitting: Internal Medicine

## 2023-11-16 ENCOUNTER — Telehealth (INDEPENDENT_AMBULATORY_CARE_PROVIDER_SITE_OTHER): Admitting: Internal Medicine

## 2023-11-16 DIAGNOSIS — I10 Essential (primary) hypertension: Secondary | ICD-10-CM

## 2023-11-16 DIAGNOSIS — R0789 Other chest pain: Secondary | ICD-10-CM

## 2023-11-16 DIAGNOSIS — R944 Abnormal results of kidney function studies: Secondary | ICD-10-CM | POA: Diagnosis not present

## 2023-11-16 DIAGNOSIS — R040 Epistaxis: Secondary | ICD-10-CM | POA: Diagnosis not present

## 2023-11-16 NOTE — Progress Notes (Signed)
 Virtual Visit via Video Note  I connected with Thomas Mcgee on 11/16/23 at 10:40 AM EDT by a video enabled telemedicine application and verified that I am speaking with the correct person using two identifiers.   I discussed the limitations of evaluation and management by telemedicine and the availability of in person appointments. The patient expressed understanding and agreed to proceed.  I was located at our [facility] office. The patient was at home. There was no one else present in the visit.  No chief complaint on file.    History of Present Illness:  F/u on ER visit for CP. C/o nose bleed. F/u on HTN  Per Dr Franchot on 09/26/23:  Patient's workup in the ED was largely unremarkable. D-dimer within normal limits therefore my clinical judgment do not feel that further workup for PE is warranted. He remained chest pain-free throughout his entire evaluation in the ED. Given that patient's heart score is low with a negative workup in the ED and he has remained chest pain-free I do feel that he is stable discharge home with close PCP and cardiology follow-up. [name]  and was offered referral here to our cardiologist but states that he would speak to his PCP for referral. Patient advised to return to the ED if you develop worsening chest pain, shortness of breath or any other concerns which he voiced understanding. Patient is stable for discharge from the hospital at this time. I discussed with the patient that they are to follow-up with their primary care physician in the next 1-2 days. They're to return to the emergency department for symptoms that are worsening or for any other concerns. Patient voiced understanding of the discharge instructions as well as return precautions/warning signs and symptoms. Patient will be discharged home at this time. All questions answered and agrees with plan for discharge and follow up.   ROS   Observations/Objective: The patient  appears to be in no acute distress  Assessment and Plan:  Problem List Items Addressed This Visit     Chest pain, atypical - Primary   Atypical chest pain.  No family history of heart disease. Dr Debi note (ER visit on 09/26/23) was reviewed. He recommended cardiology consultation.  Will arrange. Follow-up with me in a couple weeks      Relevant Orders   Ambulatory referral to Cardiology   Decreased GFR   Monitor GFR Hydrate well      Relevant Orders   Ambulatory referral to Cardiology   Epistaxis   New.  [place] will see me if not better.  Continue to treat allergies.  Normal saline spray      HTN (hypertension)   Continue with amlodipine  daily Monitor labs Hydrate well      Relevant Orders   Ambulatory referral to Cardiology     No orders of the defined types were placed in this encounter.    Follow Up Instructions:    I discussed the assessment and treatment plan with the patient. The patient was provided an opportunity to ask questions and all were answered. The patient agreed with the plan and demonstrated an understanding of the instructions.   The patient was advised to call back or seek an in-person evaluation if the symptoms worsen or if the condition fails to improve as anticipated.  I provided face-to-face time during this encounter. We were at different locations.   Thomas Noel, MD

## 2023-11-16 NOTE — Assessment & Plan Note (Signed)
 NewSABRA [place] will see me if not better.  Continue to treat allergies.  Normal saline spray

## 2023-11-16 NOTE — Assessment & Plan Note (Signed)
 Continue with amlodipine  daily Monitor labs Hydrate well

## 2023-11-16 NOTE — Assessment & Plan Note (Addendum)
 Atypical chest pain.  No family history of heart disease. Dr Debi note (ER visit on 09/26/23) was reviewed. He recommended cardiology consultation.  Will arrange. Follow-up with me in a couple weeks

## 2023-11-16 NOTE — Assessment & Plan Note (Signed)
Monitor GFR Hydrate well 

## 2023-11-23 ENCOUNTER — Inpatient Hospital Stay: Admitting: Internal Medicine

## 2023-11-30 ENCOUNTER — Inpatient Hospital Stay: Admitting: Internal Medicine

## 2023-12-07 ENCOUNTER — Inpatient Hospital Stay: Admitting: Internal Medicine

## 2024-01-18 ENCOUNTER — Ambulatory Visit: Admitting: Internal Medicine

## 2024-01-18 ENCOUNTER — Encounter: Payer: Self-pay | Admitting: Internal Medicine

## 2024-01-18 VITALS — BP 104/72 | HR 75 | Temp 98.1°F | Ht 65.0 in | Wt 187.2 lb

## 2024-01-18 DIAGNOSIS — I10 Essential (primary) hypertension: Secondary | ICD-10-CM

## 2024-01-18 DIAGNOSIS — R944 Abnormal results of kidney function studies: Secondary | ICD-10-CM | POA: Diagnosis not present

## 2024-01-18 DIAGNOSIS — Z1211 Encounter for screening for malignant neoplasm of colon: Secondary | ICD-10-CM | POA: Diagnosis not present

## 2024-01-18 DIAGNOSIS — R109 Unspecified abdominal pain: Secondary | ICD-10-CM | POA: Diagnosis not present

## 2024-01-18 DIAGNOSIS — Z0000 Encounter for general adult medical examination without abnormal findings: Secondary | ICD-10-CM

## 2024-01-18 LAB — CBC WITH DIFFERENTIAL/PLATELET
Basophils Absolute: 0 K/uL (ref 0.0–0.1)
Basophils Relative: 1 % (ref 0.0–3.0)
Eosinophils Absolute: 0.4 K/uL (ref 0.0–0.7)
Eosinophils Relative: 9 % — ABNORMAL HIGH (ref 0.0–5.0)
HCT: 43 % (ref 39.0–52.0)
Hemoglobin: 14.4 g/dL (ref 13.0–17.0)
Lymphocytes Relative: 41.9 % (ref 12.0–46.0)
Lymphs Abs: 1.8 K/uL (ref 0.7–4.0)
MCHC: 33.5 g/dL (ref 30.0–36.0)
MCV: 89 fl (ref 78.0–100.0)
Monocytes Absolute: 0.3 K/uL (ref 0.1–1.0)
Monocytes Relative: 7.7 % (ref 3.0–12.0)
Neutro Abs: 1.8 K/uL (ref 1.4–7.7)
Neutrophils Relative %: 40.4 % — ABNORMAL LOW (ref 43.0–77.0)
Platelets: 212 K/uL (ref 150.0–400.0)
RBC: 4.84 Mil/uL (ref 4.22–5.81)
RDW: 14.2 % (ref 11.5–15.5)
WBC: 4.4 K/uL (ref 4.0–10.5)

## 2024-01-18 LAB — COMPREHENSIVE METABOLIC PANEL WITH GFR
ALT: 9 U/L (ref 0–53)
AST: 18 U/L (ref 0–37)
Albumin: 4.6 g/dL (ref 3.5–5.2)
Alkaline Phosphatase: 47 U/L (ref 39–117)
BUN: 9 mg/dL (ref 6–23)
CO2: 32 meq/L (ref 19–32)
Calcium: 9.8 mg/dL (ref 8.4–10.5)
Chloride: 102 meq/L (ref 96–112)
Creatinine, Ser: 1.35 mg/dL (ref 0.40–1.50)
GFR: 60.34 mL/min (ref >60.00)
Glucose, Bld: 97 mg/dL (ref 70–99)
Potassium: 4.1 meq/L (ref 3.5–5.1)
Sodium: 141 meq/L (ref 135–145)
Total Bilirubin: 0.8 mg/dL (ref 0.2–1.2)
Total Protein: 7.3 g/dL (ref 6.0–8.3)

## 2024-01-18 LAB — LIPID PANEL
Cholesterol: 166 mg/dL (ref 0–200)
HDL: 34.5 mg/dL — ABNORMAL LOW (ref >39.00)
LDL Cholesterol: 116 mg/dL — ABNORMAL HIGH (ref 0–99)
NonHDL: 131.17
Total CHOL/HDL Ratio: 5
Triglycerides: 77 mg/dL (ref 0.0–149.0)
VLDL: 15.4 mg/dL (ref 0.0–40.0)

## 2024-01-18 LAB — TSH: TSH: 4.44 u[IU]/mL (ref 0.35–5.50)

## 2024-01-18 LAB — URINALYSIS
Bilirubin Urine: NEGATIVE
Hgb urine dipstick: NEGATIVE
Ketones, ur: NEGATIVE
Leukocytes,Ua: NEGATIVE
Nitrite: NEGATIVE
Specific Gravity, Urine: <=1.005 — AB (ref 1.000–1.030)
Total Protein, Urine: NEGATIVE
Urine Glucose: NEGATIVE
Urobilinogen, UA: 0.2 (ref 0.0–1.0)
pH: 6 (ref 5.0–8.0)

## 2024-01-18 LAB — PSA: PSA: 1.99 ng/mL (ref 0.10–4.00)

## 2024-01-18 NOTE — Assessment & Plan Note (Signed)
 Continue with amlodipine  daily Monitor labs Hydrate well

## 2024-01-18 NOTE — Assessment & Plan Note (Signed)
 Monitor GFR Hydrate well

## 2024-01-18 NOTE — Progress Notes (Signed)
 Subjective:  Patient ID: Thomas Mcgee, male    DOB: [birth year]  Age: 53 y.o. MRN: [id number]  CC: Abdominal Pain (6 Month follow up. Sharp abdominal pain (right) noticed within the last few days)   HPI Tigran Haynie presents for R flank pain x weeks - resolved: ?renal colic. He has been juicing a lot... Well exam  Outpatient Medications Prior to Visit  Medication Sig Dispense Refill   amLODipine  (NORVASC ) 10 MG tablet TAKE 1 TABLET BY MOUTH EVERY DAY. Overdue for Annual appt must see provider for future refills 90 tablet 3   atovaquone -proguanil (MALARONE ) 250-100 MG TABS tablet 1 po qd - start 2 d before travel, take daily while in [place] and x 1 wk after return. Then stop 68 tablet 1   Cholecalciferol (VITAMIN D3) 50 MCG (2000 UT) capsule Take 1 capsule (2,000 Units total) by mouth daily. 100 capsule 3   fexofenadine -pseudoephedrine (ALLEGRA -D) 60-120 MG 12 hr tablet Take 1 tablet by mouth 2 (two) times daily as needed. 60 tablet 3   meloxicam  (MOBIC ) 7.5 MG tablet Take 1 tablet (7.5 mg total) by mouth daily. 15 tablet 0   tiZANidine  (ZANAFLEX ) 4 MG tablet Take 1 tablet (4 mg total) by mouth every 8 (eight) hours as needed. 30 tablet 0   fluticasone  (FLONASE ) 50 MCG/ACT nasal spray Place 2 sprays into both nostrils daily. (Patient not taking: Reported on 01/18/2024) 16 g 6   triamcinolone  cream (KENALOG ) 0.1 % Apply 1 Application topically 2 (two) times daily. (Patient not taking: Reported on 01/18/2024) 450 g 3   No facility-administered medications prior to visit.    ROS: Review of Systems  Constitutional:  Negative for appetite change, fatigue and unexpected weight change.  HENT:  Negative for congestion, nosebleeds, sneezing, sore throat and trouble swallowing.   Eyes:  Negative for itching and visual disturbance.  Respiratory:  Negative for cough.   Cardiovascular:  Negative for chest pain, palpitations and leg swelling.  Gastrointestinal:  Negative for abdominal distention,  blood in stool, diarrhea and nausea.  Genitourinary:  Negative for frequency and hematuria.  Musculoskeletal:  Negative for back pain, gait problem, joint swelling and neck pain.  Skin:  Negative for rash.  Neurological:  Negative for dizziness, tremors, speech difficulty and weakness.  Psychiatric/Behavioral:  Negative for agitation, dysphoric mood and sleep disturbance. The patient is not nervous/anxious.     Objective:  BP 104/72   Pulse 75   Temp 98.1 F (36.7 C)   Ht 5' 5 (1.651 m)   Wt 187 lb 3.2 oz (84.9 kg)   SpO2 98%   BMI 31.15 kg/m   BP Readings from Last 3 Encounters:  01/18/24 104/72  07/25/23 (!) 137/99  07/17/23 (!) 124/94    Wt Readings from Last 3 Encounters:  01/18/24 187 lb 3.2 oz (84.9 kg)  07/17/23 181 lb 12.8 oz (82.5 kg)  12/08/22 185 lb (83.9 kg)    Physical Exam Constitutional:      General: He is not in acute distress.    Appearance: He is well-developed.     Comments: NAD  Eyes:     Conjunctiva/sclera: Conjunctivae normal.     Pupils: Pupils are equal, round, and reactive to light.  Neck:     Thyroid : No thyromegaly.     Vascular: No JVD.  Cardiovascular:     Rate and Rhythm: Normal rate and regular rhythm.     Heart sounds: Normal heart sounds. No murmur heard.    No friction rub.  No gallop.  Pulmonary:     Effort: Pulmonary effort is normal. No respiratory distress.     Breath sounds: Normal breath sounds. No wheezing or rales.  Chest:     Chest wall: No tenderness.  Abdominal:     General: Bowel sounds are normal. There is no distension.     Palpations: Abdomen is soft. There is no mass.     Tenderness: There is no abdominal tenderness. There is no guarding or rebound.  Musculoskeletal:        General: No tenderness. Normal range of motion.     Cervical back: Normal range of motion.  Lymphadenopathy:     Cervical: No cervical adenopathy.  Skin:    General: Skin is warm and dry.     Findings: No rash.  Neurological:      Mental Status: He is alert and oriented to person, place, and time.     Cranial Nerves: No cranial nerve deficit.     Motor: No abnormal muscle tone.     Coordination: Coordination normal.     Gait: Gait normal.     Deep Tendon Reflexes: Reflexes are normal and symmetric.  Psychiatric:        Behavior: Behavior normal.        Thought Content: Thought content normal.        Judgment: Judgment normal.   Rectal - per GI  Lab Results  Component Value Date   WBC 4.7 12/08/2022   HGB 14.7 12/08/2022   HCT 44.3 12/08/2022   PLT 222.0 12/08/2022   GLUCOSE 99 07/17/2023   CHOL 160 12/08/2022   TRIG 141.0 12/08/2022   HDL 28.10 (L) 12/08/2022   LDLCALC 104 (H) 12/08/2022   ALT 9 07/17/2023   AST 17 07/17/2023   NA 140 07/17/2023   K 4.2 07/17/2023   CL 103 07/17/2023   CREATININE 1.36 07/17/2023   BUN 12 07/17/2023   CO2 31 07/17/2023   TSH 1.65 12/08/2022   PSA 1.75 07/17/2023   HGBA1C 5.7 12/08/2022    DG Chest 2 View Result Date: 07/25/2023 CLINICAL DATA:  Left-sided chest pain. EXAM: CHEST - 2 VIEW COMPARISON:  None Available. FINDINGS: The heart size and mediastinal contours are within normal limits. Both lungs are clear. The visualized skeletal structures are unremarkable. IMPRESSION: No active cardiopulmonary disease. Electronically Signed   By: Norleen DELENA Kil M.D.   On: 07/25/2023 12:11    Assessment & Plan:   Problem List Items Addressed This Visit     Decreased GFR   Monitor GFR Hydrate well      Flank pain, acute   Recurrent ?renal colic Hydrate well, UA Abd CT if in pain Reduce juicing      HTN (hypertension)   Continue with amlodipine  daily Monitor labs Hydrate well      Well adult exam - Primary   We discussed age appropriate health related issues, including available/recomended screening tests and vaccinations. Labs were ordered to be later reviewed . All questions were answered. We discussed one or more of the following - seat belt use, use of  sunscreen/sun exposure exercise, fall risk reduction, second hand smoke exposure, firearm use and storage, seat belt use, a need for adhering to healthy diet and exercise. Labs were ordered.  All questions were answered. Will refer to GI to have a colonoscopy done again Reduce juicing, eat whole fruits      Relevant Orders   TSH   Urinalysis   CBC with Differential/Platelet  Lipid panel   PSA   Comprehensive metabolic panel with GFR   Other Visit Diagnoses       Screening for colon cancer       Relevant Orders   Ambulatory referral to Gastroenterology         No orders of the defined types were placed in this encounter.     Follow-up: Return in about 3 months (around 04/18/2024) for a follow-up visit.  Marolyn Noel, MD

## 2024-01-18 NOTE — Patient Instructions (Signed)
 [facility]  [facility] [address] Address: [address], [place], [place] [address] Phone: [phone]

## 2024-01-18 NOTE — Assessment & Plan Note (Addendum)
 We discussed age appropriate health related issues, including available/recomended screening tests and vaccinations. Labs were ordered to be later reviewed . All questions were answered. We discussed one or more of the following - seat belt use, use of sunscreen/sun exposure exercise, fall risk reduction, second hand smoke exposure, firearm use and storage, seat belt use, a need for adhering to healthy diet and exercise. Labs were ordered.  All questions were answered. Will refer to GI to have a colonoscopy done again Reduce juicing, eat whole fruits

## 2024-01-18 NOTE — Assessment & Plan Note (Signed)
 Recurrent ?renal colic Hydrate well, UA Abd CT if in pain Reduce juicing

## 2024-01-23 ENCOUNTER — Ambulatory Visit: Payer: Self-pay | Admitting: Internal Medicine

## 2024-03-26 ENCOUNTER — Ambulatory Visit: Payer: Self-pay | Admitting: Internal Medicine

## 2024-03-26 ENCOUNTER — Ambulatory Visit
Admission: RE | Admit: 2024-03-26 | Discharge: 2024-03-26 | Disposition: A | Discharge status: Home / Self Care | Source: Ambulatory Visit | Attending: Internal Medicine | Admitting: Internal Medicine

## 2024-03-26 ENCOUNTER — Ambulatory Visit (INDEPENDENT_AMBULATORY_CARE_PROVIDER_SITE_OTHER): Admitting: Internal Medicine

## 2024-03-26 ENCOUNTER — Encounter: Payer: Self-pay | Admitting: Internal Medicine

## 2024-03-26 VITALS — BP 120/86 | HR 87 | Temp 98.2°F | Ht 65.0 in

## 2024-03-26 DIAGNOSIS — R0781 Pleurodynia: Secondary | ICD-10-CM | POA: Insufficient documentation

## 2024-03-26 DIAGNOSIS — K219 Gastro-esophageal reflux disease without esophagitis: Secondary | ICD-10-CM

## 2024-03-26 DIAGNOSIS — R0789 Other chest pain: Secondary | ICD-10-CM

## 2024-03-26 LAB — CBC WITH DIFFERENTIAL/PLATELET
Basophils Absolute: 0.1 K/uL (ref 0.0–0.1)
Basophils Relative: 1.1 % (ref 0.0–3.0)
Eosinophils Absolute: 0.4 K/uL (ref 0.0–0.7)
Eosinophils Relative: 6.8 % — ABNORMAL HIGH (ref 0.0–5.0)
HCT: 43.5 % (ref 39.0–52.0)
Hemoglobin: 15.1 g/dL (ref 13.0–17.0)
Lymphocytes Relative: 33.3 % (ref 12.0–46.0)
Lymphs Abs: 1.8 K/uL (ref 0.7–4.0)
MCHC: 34.6 g/dL (ref 30.0–36.0)
MCV: 89.2 fl (ref 78.0–100.0)
Monocytes Absolute: 0.3 K/uL (ref 0.1–1.0)
Monocytes Relative: 5.9 % (ref 3.0–12.0)
Neutro Abs: 2.8 K/uL (ref 1.4–7.7)
Neutrophils Relative %: 52.9 % (ref 43.0–77.0)
Platelets: 251 K/uL (ref 150.0–400.0)
RBC: 4.88 Mil/uL (ref 4.22–5.81)
RDW: 13.6 % (ref 11.5–15.5)
WBC: 5.3 K/uL (ref 4.0–10.5)

## 2024-03-26 LAB — COMPREHENSIVE METABOLIC PANEL WITH GFR
ALT: 13 U/L (ref 0–53)
AST: 22 U/L (ref 0–37)
Albumin: 4.9 g/dL (ref 3.5–5.2)
Alkaline Phosphatase: 58 U/L (ref 39–117)
BUN: 15 mg/dL (ref 6–23)
CO2: 31 meq/L (ref 19–32)
Calcium: 10 mg/dL (ref 8.4–10.5)
Chloride: 99 meq/L (ref 96–112)
Creatinine, Ser: 1.39 mg/dL (ref 0.40–1.50)
GFR: 58.19 mL/min — ABNORMAL LOW (ref >60.00)
Glucose, Bld: 105 mg/dL — ABNORMAL HIGH (ref 70–99)
Potassium: 4.3 meq/L (ref 3.5–5.1)
Sodium: 136 meq/L (ref 135–145)
Total Bilirubin: 0.5 mg/dL (ref 0.2–1.2)
Total Protein: 7.7 g/dL (ref 6.0–8.3)

## 2024-03-26 LAB — SEDIMENTATION RATE: Sed Rate: 5 mm/h (ref 0–20)

## 2024-03-26 LAB — CK: Total CK: 165 U/L (ref 17–232)

## 2024-03-26 MED ORDER — IOPAMIDOL (ISOVUE-370) INJECTION 76%
100.0000 mL | Freq: Once | INTRAVENOUS | Status: AC | PRN
  Administered 2024-03-26: 100 mL via INTRAVENOUS

## 2024-03-26 MED ORDER — PANTOPRAZOLE SODIUM 40 MG PO TBEC: 40.0000 mg | DELAYED_RELEASE_TABLET | Freq: Every day | ORAL | 3 refills | Status: DC

## 2024-03-26 MED ORDER — TRAMADOL HCL 50 MG PO TABS: 50.0000 mg | ORAL_TABLET | Freq: Four times a day (QID) | ORAL | 0 refills | Status: AC | PRN

## 2024-03-26 NOTE — Assessment & Plan Note (Signed)
Protonix po 

## 2024-03-26 NOTE — Assessment & Plan Note (Addendum)
 S/p UC [facility] visit for severe L chest pain, left arm pain - comes and goes x 1 month He had an EKG, Rx Ibuprofen, given Toradol IM - it helped. C/o CP now. R/o PE - chest CT angio Labs L upper anterior costo-chondral junctions are tender to palpations Tramadol prn

## 2024-03-26 NOTE — Progress Notes (Signed)
 Subjective:  Patient ID: Thomas Mcgee, male    DOB: 1970/12/07  Age: 53 y.o. MRN: [id number]  CC: Medical Management of Chronic Issues (Follow up form Urgent Care visit for chest pain, left arm pain. Also experiencing issues with bloody nose)   HPI Thomas Mcgee presents for a follow up form [facility] visit for severe L chest pain, left arm pain - comes and goes x 1 month He had an EKG, Rx Ibuprofen, given Toradol IM - it helped. C/o CP now.  Also experiencing issues with bloody nose and R hand tingling  Outpatient Medications Prior to Visit  Medication Sig Dispense Refill   amLODipine  (NORVASC ) 10 MG tablet TAKE 1 TABLET BY MOUTH EVERY DAY. Overdue for Annual appt must see provider for future refills 90 tablet 3   fluticasone  (FLONASE ) 50 MCG/ACT nasal spray Place 2 sprays into both nostrils daily. (Patient not taking: Reported on 03/26/2024) 16 g 6   atovaquone -proguanil (MALARONE ) 250-100 MG TABS tablet 1 po qd - start 2 d before travel, take daily while in [place] and x 1 wk after return. Then stop 68 tablet 1   Cholecalciferol (VITAMIN D3) 50 MCG (2000 UT) capsule Take 1 capsule (2,000 Units total) by mouth daily. 100 capsule 3   fexofenadine -pseudoephedrine (ALLEGRA -D) 60-120 MG 12 hr tablet Take 1 tablet by mouth 2 (two) times daily as needed. 60 tablet 3   meloxicam  (MOBIC ) 7.5 MG tablet Take 1 tablet (7.5 mg total) by mouth daily. 15 tablet 0   tiZANidine  (ZANAFLEX ) 4 MG tablet Take 1 tablet (4 mg total) by mouth every 8 (eight) hours as needed. 30 tablet 0   triamcinolone  cream (KENALOG ) 0.1 % Apply 1 Application topically 2 (two) times daily. (Patient not taking: Reported on 01/18/2024) 450 g 3   No facility-administered medications prior to visit.    ROS: Review of Systems  Constitutional:  Negative for appetite change, fatigue and unexpected weight change.  HENT:  Negative for congestion, nosebleeds, sneezing, sore throat and trouble swallowing.   Eyes:  Negative  for itching and visual disturbance.  Respiratory:  Negative for cough, shortness of breath and wheezing.   Cardiovascular:  Positive for chest pain. Negative for palpitations and leg swelling.  Gastrointestinal:  Negative for abdominal distention, blood in stool, diarrhea and nausea.  Genitourinary:  Negative for frequency and hematuria.  Musculoskeletal:  Negative for back pain, gait problem, joint swelling and neck pain.  Skin:  Negative for rash.  Neurological:  Negative for dizziness, tremors, speech difficulty and weakness.  Psychiatric/Behavioral:  Negative for agitation, dysphoric mood and sleep disturbance. The patient is not nervous/anxious.     Objective:  BP 120/86   Pulse 87   Temp 98.2 F (36.8 C)   Ht 5' 5 (1.651 m)   SpO2 97%   BMI 31.15 kg/m   BP Readings from Last 3 Encounters:  03/26/24 120/86  01/18/24 104/72  07/25/23 (!) 137/99    Wt Readings from Last 3 Encounters:  01/18/24 187 lb 3.2 oz (84.9 kg)  07/17/23 181 lb 12.8 oz (82.5 kg)  12/08/22 185 lb (83.9 kg)    Physical Exam Constitutional:      General: He is not in acute distress.    Appearance: He is well-developed.     Comments: NAD  Eyes:     Conjunctiva/sclera: Conjunctivae normal.     Pupils: Pupils are equal, round, and reactive to light.  Neck:     Thyroid : No thyromegaly.  Vascular: No JVD.  Cardiovascular:     Rate and Rhythm: Normal rate and regular rhythm.     Heart sounds: Normal heart sounds. No murmur heard.    No friction rub. No gallop.  Pulmonary:     Effort: Pulmonary effort is normal. No respiratory distress.     Breath sounds: Normal breath sounds. No wheezing or rales.  Chest:     Chest wall: No tenderness.  Abdominal:     General: Bowel sounds are normal. There is no distension.     Palpations: Abdomen is soft. There is no mass.     Tenderness: There is no abdominal tenderness. There is no guarding or rebound.  Musculoskeletal:        General: Tenderness  present. Normal range of motion.     Cervical back: Normal range of motion.     Right lower leg: No edema.     Left lower leg: No edema.  Lymphadenopathy:     Cervical: No cervical adenopathy.  Skin:    General: Skin is warm and dry.     Findings: No rash.  Neurological:     Mental Status: He is alert and oriented to person, place, and time.     Cranial Nerves: No cranial nerve deficit.     Motor: No abnormal muscle tone.     Coordination: Coordination normal.     Gait: Gait normal.     Deep Tendon Reflexes: Reflexes are normal and symmetric.  Psychiatric:        Behavior: Behavior normal.        Thought Content: Thought content normal.        Judgment: Judgment normal.   L upper anterior costo-chondral junctions are tender to palpations  Lab Results  Component Value Date   WBC 5.3 03/26/2024   HGB 15.1 03/26/2024   HCT 43.5 03/26/2024   PLT 251.0 03/26/2024   GLUCOSE 105 (H) 03/26/2024   CHOL 166 01/18/2024   TRIG 77.0 01/18/2024   HDL 34.50 (L) 01/18/2024   LDLCALC 116 (H) 01/18/2024   ALT 13 03/26/2024   AST 22 03/26/2024   NA 136 03/26/2024   K 4.3 03/26/2024   CL 99 03/26/2024   CREATININE 1.39 03/26/2024   BUN 15 03/26/2024   CO2 31 03/26/2024   TSH 4.44 01/18/2024   PSA 1.99 01/18/2024   HGBA1C 5.7 12/08/2022    DG Chest 2 View Result Date: 07/25/2023 CLINICAL DATA:  Left-sided chest pain. EXAM: CHEST - 2 VIEW COMPARISON:  None Available. FINDINGS: The heart size and mediastinal contours are within normal limits. Both lungs are clear. The visualized skeletal structures are unremarkable. IMPRESSION: No active cardiopulmonary disease. Electronically Signed   By: Norleen DELENA Kil M.D.   On: 07/25/2023 12:11    Assessment & Plan:   Problem List Items Addressed This Visit     Chest pain, atypical   Chest pain, pleuritic - Primary   S/p UC [facility] visit for severe L chest pain, left arm pain - comes and goes x 1 month He had an EKG, Rx Ibuprofen, given  Toradol IM - it helped. C/o CP now. R/o PE - chest CT angio Labs L upper anterior costo-chondral junctions are tender to palpations Tramadol prn      Relevant Orders   CK (Completed)   D-dimer, quantitative   Sedimentation rate (Completed)   Troponin I   CBC with Differential/Platelet (Completed)   Comprehensive metabolic panel with GFR (Completed)   CT Angio  Chest Pulmonary Embolism (PE) W or WO Contrast   GERD (gastroesophageal reflux disease)   Protonix po      Relevant Medications   pantoprazole (PROTONIX) 40 MG tablet      Meds ordered this encounter  Medications   traMADol (ULTRAM) 50 MG tablet    Sig: Take 1 tablet (50 mg total) by mouth every 6 (six) hours as needed for up to 5 days.    Dispense:  20 tablet    Refill:  0   pantoprazole (PROTONIX) 40 MG tablet    Sig: Take 1 tablet (40 mg total) by mouth daily.    Dispense:  30 tablet    Refill:  3      Follow-up: Return in about 1 week (around 04/02/2024) for a follow-up visit.  Marolyn Noel, MD

## 2024-03-26 NOTE — Patient Instructions (Signed)
[facility] Emergency room  Address: [address], [place], [place] [address] Hours: Open 24 hours Phone: [phone]   [place] [facility] Address: [address] [place],  [address] Hours: Open 24 hours Phone: [phone]

## 2024-03-27 ENCOUNTER — Telehealth: Payer: Self-pay

## 2024-03-27 LAB — D-DIMER, QUANTITATIVE: D-Dimer, Quant: <0.19 ug{FEU}/mL (ref <0.50)

## 2024-03-27 LAB — TROPONIN I: Troponin I: 7 ng/L (ref <=47)

## 2024-03-27 NOTE — Telephone Encounter (Signed)
 Copied from CRM [id number]. Topic: General - Other >> Mar 27, 2024 11:36 AM Macario HERO wrote: Reason for CRM: Patient called to ask if Dr. Garald sent a referral for cardiovascular speciality because he received a call from [facility] regarding scheduling an appointment. Requesting a call back from provider's nurse.

## 2024-03-27 NOTE — Telephone Encounter (Addendum)
 Attempted to reach the pt to inform him of the following ... I see referral from July that was sent to a [facility]... nothing with [facility]

## 2024-03-31 MED ORDER — PREDNISONE 10 MG PO TABS: ORAL_TABLET | ORAL | 1 refills | Status: DC

## 2024-04-01 ENCOUNTER — Encounter: Payer: Self-pay | Admitting: Internal Medicine

## 2024-04-02 ENCOUNTER — Telehealth: Payer: Self-pay

## 2024-04-02 NOTE — Telephone Encounter (Signed)
 Copied from CRM #[id number]. Topic: General - Other >> Apr 02, 2024  8:32 AM Frederich PARAS wrote: Reason for CRM: pt would like dr. Garald to give him a call asap in regards to the referral , pt callback# [phone] please call pt as soon as possible this is in regarding my chart message as well

## 2024-04-03 ENCOUNTER — Emergency Department (HOSPITAL_BASED_OUTPATIENT_CLINIC_OR_DEPARTMENT_OTHER): Admitting: Radiology

## 2024-04-03 ENCOUNTER — Encounter (HOSPITAL_BASED_OUTPATIENT_CLINIC_OR_DEPARTMENT_OTHER): Payer: Self-pay | Admitting: Emergency Medicine

## 2024-04-03 ENCOUNTER — Encounter: Payer: Self-pay | Admitting: Internal Medicine

## 2024-04-03 ENCOUNTER — Ambulatory Visit: Payer: Self-pay

## 2024-04-03 ENCOUNTER — Other Ambulatory Visit: Payer: Self-pay

## 2024-04-03 ENCOUNTER — Emergency Department (HOSPITAL_BASED_OUTPATIENT_CLINIC_OR_DEPARTMENT_OTHER)
Admission: EM | Admit: 2024-04-03 | Discharge: 2024-04-03 | Disposition: A | Discharge status: Home / Self Care | Attending: Emergency Medicine | Admitting: Emergency Medicine

## 2024-04-03 DIAGNOSIS — R079 Chest pain, unspecified: Secondary | ICD-10-CM | POA: Insufficient documentation

## 2024-04-03 LAB — CBC
HCT: 44.1 % (ref 39.0–52.0)
Hemoglobin: 15.3 g/dL (ref 13.0–17.0)
MCH: 29.8 pg (ref 26.0–34.0)
MCHC: 34.7 g/dL (ref 30.0–36.0)
MCV: 86 fL (ref 80.0–100.0)
Platelets: 256 K/uL (ref 150–400)
RBC: 5.13 MIL/uL (ref 4.22–5.81)
RDW: 12.9 % (ref 11.5–15.5)
WBC: 5.8 K/uL (ref 4.0–10.5)
nRBC: 0 % (ref 0.0–0.2)

## 2024-04-03 LAB — BASIC METABOLIC PANEL WITH GFR
Anion gap: 12 (ref 5–15)
BUN: 11 mg/dL (ref 6–20)
CO2: 27 mmol/L (ref 22–32)
Calcium: 10.7 mg/dL — ABNORMAL HIGH (ref 8.9–10.3)
Chloride: 100 mmol/L (ref 98–111)
Creatinine, Ser: 1.28 mg/dL — ABNORMAL HIGH (ref 0.61–1.24)
GFR, Estimated: >60 mL/min (ref >60)
Glucose, Bld: 100 mg/dL — ABNORMAL HIGH (ref 70–99)
Potassium: 3.9 mmol/L (ref 3.5–5.1)
Sodium: 139 mmol/L (ref 135–145)

## 2024-04-03 LAB — TROPONIN T, HIGH SENSITIVITY: Troponin T High Sensitivity: <15 ng/L (ref 0–19)

## 2024-04-03 NOTE — Telephone Encounter (Signed)
 Please see mychart encounters.

## 2024-04-03 NOTE — Telephone Encounter (Signed)
 FYI Only or Action Required?: Action required by provider: clinical question for provider.  Patient was last seen in primary care on 03/26/2024 by Plotnikov, Karlynn GAILS, MD.  Called Nurse Triage reporting No chief complaint on file..  Symptoms began several months ago.  Interventions attempted: Prescription medications: Amlodipine .  Symptoms are: unchanged.  Triage Disposition: No disposition on file.  Patient/caregiver understands and will follow disposition?:  Reason for Disposition  [1] Caller requesting NON-URGENT health information AND [2] PCP's office is the best resource  Answer Assessment - Initial Assessment Questions Patient's blood pressure this morning 125/68. Compliant with BP medication.   1. REASON FOR CALL: What is the main reason for your call? or How can I best help you?     Patient had a referral placed to cardiology back in July and was never called to schedule. Patient is extremely upset regarding this matter because now the cardiologist cannot see him until 05/03/24. Patient is requesting the office do something about this as the referral is long over due. Patient requesting call back from PCP. Please advise.   2. SYMPTOMS : Do you have any symptoms?      On going left arm and chest pain, not new or worsening.  Protocols used: Information Only Call - No Triage-A-AH  Copied from CRM [id number]. Topic: Clinical - Red Word Triage >> Apr 03, 2024 12:25 PM Eva FALCON wrote: Red Word that prompted transfer to Nurse Triage: having left arm pain and chest pain.

## 2024-04-03 NOTE — ED Triage Notes (Signed)
 Left side chest pain into left arm X 2 weeks Denies sob, no n/v Seen by pcp and given tramadol  and prednisone , has not started prednisone  yet

## 2024-04-03 NOTE — Discharge Instructions (Signed)
 Your tests here look good.  Its unlikely to be a heart attack.  I think you have pulled a muscle Take tylenol 1000mg (2 extra strength) four times a day.  You take take the steroids or take below. Take 4 over the counter ibuprofen tablets 3 times a day or 2 over-the-counter naproxen  tablets twice a day for pain.

## 2024-04-03 NOTE — ED Provider Notes (Signed)
 [place] EMERGENCY DEPARTMENT AT [facility] Provider Note   CSN: [id number] Arrival date & time: 04/03/24  8090     Patient presents with: Chest Pain   Thomas Mcgee is a 53 y.o. male.   53 yo M with a cc of left sided chest pain.  Going on for about 4 days now.  No reported injury.  Pain worse with movement of the left arm.  Started about the bicep region.  Radiates to the chest.    Chest Pain      Prior to Admission medications   Medication Sig Start Date End Date Taking? Authorizing Provider  amLODipine  (NORVASC ) 10 MG tablet TAKE 1 TABLET BY MOUTH EVERY DAY. Overdue for Annual appt must see provider for future refills 07/17/23   Plotnikov, Karlynn GAILS, MD  fluticasone  (FLONASE ) 50 MCG/ACT nasal spray Place 2 sprays into both nostrils daily. Patient not taking: Reported on 03/26/2024 08/23/21   Plotnikov, Karlynn GAILS, MD  pantoprazole  (PROTONIX ) 40 MG tablet Take 1 tablet (40 mg total) by mouth daily. 03/26/24   Plotnikov, Aleksei V, MD  predniSONE  (DELTASONE ) 10 MG tablet Prednisone  10 mg: take 4 tabs a day x 3 days; then 3 tabs a day x 4 days; then 2 tabs a day x 4 days, then 1 tab a day x 6 days, then stop. Take pc. 03/31/24   Plotnikov, Aleksei V, MD    Allergies: Patient has no known allergies.    Review of Systems  Cardiovascular:  Positive for chest pain.    Updated Vital Signs BP (!) 115/92 (BP Location: Right Arm)   Pulse 78   Temp 98.2 F (36.8 C) (Oral)   Resp 20   SpO2 100%   Physical Exam Vitals and nursing note reviewed.  Constitutional:      Appearance: He is well-developed.  HENT:     Head: Normocephalic and atraumatic.  Eyes:     Pupils: Pupils are equal, round, and reactive to light.  Neck:     Vascular: No JVD.  Cardiovascular:     Rate and Rhythm: Normal rate and regular rhythm.     Heart sounds: No murmur heard.    No friction rub. No gallop.  Pulmonary:     Effort: No respiratory distress.     Breath sounds: No wheezing.   Abdominal:     General: There is no distension.     Tenderness: There is no abdominal tenderness. There is no guarding or rebound.  Musculoskeletal:        General: Normal range of motion.     Cervical back: Normal range of motion and neck supple.     Right lower leg: Tenderness present.     Comments: Pain about the left trapezius reproduces the patient's discomfort.  Skin:    Coloration: Skin is not pale.     Findings: No rash.  Neurological:     Mental Status: He is alert and oriented to person, place, and time.  Psychiatric:        Behavior: Behavior normal.     (all labs ordered are listed, but only abnormal results are displayed) Labs Reviewed  BASIC METABOLIC PANEL WITH GFR - Abnormal; Notable for the following components:      Result Value   Glucose, Bld 100 (*)    Creatinine, Ser 1.28 (*)    Calcium 10.7 (*)    All other components within normal limits  CBC  TROPONIN T, HIGH SENSITIVITY  TROPONIN T, HIGH SENSITIVITY  EKG: EKG Interpretation Date/Time:  Wednesday April 03 2024 19:15:51 EST Ventricular Rate:  84 PR Interval:  166 QRS Duration:  88 QT Interval:  352 QTC Calculation: 416 R Axis:   -55  Text Interpretation: Sinus rhythm Ventricular premature complex LAD, consider left anterior fascicular block Abnormal R-wave progression, late transition No old tracing to compare Confirmed by Emil Share [phone]) on 04/03/2024 8:47:47 PM  Radiology: DG Chest 2 View Result Date: 04/03/2024 EXAM: 2 VIEW(S) XRAY OF THE CHEST 04/03/2024 08:14:00 PM COMPARISON: 07/25/2023 CLINICAL HISTORY: left chest pain FINDINGS: LUNGS AND PLEURA: No focal pulmonary opacity. No pleural effusion. No pneumothorax. HEART AND MEDIASTINUM: No acute abnormality of the cardiac and mediastinal silhouettes. BONES AND SOFT TISSUES: No acute osseous abnormality. IMPRESSION: 1. No acute cardiopulmonary process. Electronically signed by: Pinkie Pebbles MD 04/03/2024 08:17 PM EST RP  Workstation: [id number]     Procedures   Medications Ordered in the ED - No data to display                                  Medical Decision Making Amount and/or Complexity of Data Reviewed Labs: ordered. Radiology: ordered.   53 yo M with a cc of left sided chest pain.  Going on for about 4 days.  Denies injury.  Pain atypical reproduced on exam.  Labs without significant electrolyte abnormalities.  Trop negative. CXR independently interpreted by me without focal infiltrate or pneumothorax.   Treat as musculoskeletal.  PCP follow up.   11:11 PM:  I have discussed the diagnosis/risks/treatment options with the patient.  Evaluation and diagnostic testing in the emergency department does not suggest an emergent condition requiring admission or immediate intervention beyond what has been performed at this time.  They will follow up with PCP. We also discussed returning to the ED immediately if new or worsening sx occur. We discussed the sx which are most concerning (e.g., sudden worsening pain, fever, inability to tolerate by mouth) that necessitate immediate return. Medications administered to the patient during their visit and any new prescriptions provided to the patient are listed below.  Medications given during this visit Medications - No data to display   The patient appears reasonably screen and/or stabilized for discharge and I doubt any other medical condition or other [facility] requiring further screening, evaluation, or treatment in the ED at this time prior to discharge.       Final diagnoses:  Nonspecific chest pain    ED Discharge Orders     None          Emil Share, DO 04/03/24 2311

## 2024-04-08 ENCOUNTER — Ambulatory Visit: Admitting: Cardiology

## 2024-04-08 MED ORDER — INDOMETHACIN 50 MG PO CAPS: 50.0000 mg | ORAL_CAPSULE | Freq: Two times a day (BID) | ORAL | 1 refills | Status: DC

## 2024-04-08 NOTE — Progress Notes (Deleted)
 Cardiology Office Note:    Date:  04/08/2024   ID:  Thomas Mcgee, DOB 08-26-1970, MRN [id number]  PCP:  Garald Karlynn GAILS, MD  Cardiologist:  None  Electrophysiologist:  None   Referring MD: Garald Karlynn GAILS, MD   No chief complaint on file. ***  History of Present Illness:    Thomas Mcgee is a 53 y.o. male with a hx of hypertension, CKD who presents as an ED follow-up for chest pain.  He was seen in the ED with chest pain on 04/03/2024.  Workup unremarkable  Past Medical History:  Diagnosis Date   Decreased GFR    Hypertension     No past surgical history on file.  Current Medications: Active Medications[1]   Allergies:   Patient has no known allergies.   Social History   Socioeconomic History   Marital status: Single    Spouse name: Not on file   Number of children: Not on file   Years of education: Not on file   Highest education level: Associate degree: academic program  Occupational History   Not on file  Tobacco Use   Smoking status: Never   Smokeless tobacco: Never  Vaping Use   Vaping status: Never Used  Substance and Sexual Activity   Alcohol use: Yes   Drug use: Never   Sexual activity: Yes    Birth control/protection: None  Other Topics Concern   Not on file  Social History Narrative   Not on file   Social Drivers of Health   Tobacco Use: Low Risk (04/03/2024)   Patient History    Smoking Tobacco Use: Never    Smokeless Tobacco Use: Never    Passive Exposure: Not on file  Financial Resource Strain: Patient Declined (03/23/2024)   Overall Financial Resource Strain (CARDIA)    Difficulty of Paying Living Expenses: Patient declined  Food Insecurity: Low Risk (03/28/2024)   Received from [facility]   Epic    Within the past 12 months, you worried that your food would run out before you got money to buy more: Never true    Within the past 12 months, the food you bought just didn't last and you didn't have money to get more. :  Never true  Transportation Needs: No Transportation Needs (03/28/2024)   Received from [facility]    In the past 12 months, has lack of reliable transportation kept you from medical appointments, meetings, work or from getting things needed for daily living? : No  Physical Activity: Insufficiently Active (03/23/2024)   Exercise Vital Sign    Days of Exercise per Week: 2 days    Minutes of Exercise per Session: 30 min  Stress: No Stress Concern Present (03/23/2024)   [employer] of Occupational Health - Occupational Stress Questionnaire    Feeling of Stress: Only a little  Social Connections: Socially Integrated (03/23/2024)   Social Connection and Isolation Panel    Frequency of Communication with Friends and Family: More than three times a week    Frequency of Social Gatherings with Friends and Family: Once a week    Attends Religious Services: More than 4 times per year    Active Member of Clubs or Organizations: Yes    Attends Banker Meetings: More than 4 times per year    Marital Status: Married  Depression (PHQ2-9): Low Risk (12/08/2022)   Depression (PHQ2-9)    PHQ-2 Score: 0  Alcohol Screen: Low Risk (03/23/2024)   Alcohol Screen  Last Alcohol Screening Score (AUDIT): 1  Housing: Low Risk (03/28/2024)   Received from [facility]   Epic    What is your living situation today?: I have a steady place to live    Think about the place you live. Do you have problems with any of the following? Choose all that apply:: None/None on this list  Utilities: Low Risk (03/28/2024)   Received from [facility]   Utilities    In the past 12 months has the electric, gas, oil, or water company threatened to shut off services in your home? : No  Health Literacy: Not on file     Family History: The patient's ***family history is not on file.  ROS:   Please see the history of present illness.    *** All other systems reviewed and are  negative.  EKGs/Labs/Other Studies Reviewed:    The following studies were reviewed today: ***  EKG:  EKG is *** ordered today.  The ekg ordered today demonstrates ***  Recent Labs: 01/18/2024: TSH 4.44 03/26/2024: ALT 13 04/03/2024: BUN 11; Creatinine, Ser 1.28; Hemoglobin 15.3; Platelets 256; Potassium 3.9; Sodium 139  Recent Lipid Panel    Component Value Date/Time   CHOL 166 01/18/2024 0822   TRIG 77.0 01/18/2024 0822   HDL 34.50 (L) 01/18/2024 0822   CHOLHDL 5 01/18/2024 0822   VLDL 15.4 01/18/2024 0822   LDLCALC 116 (H) 01/18/2024 0822    Physical Exam:    VS:  There were no vitals taken for this visit.    Wt Readings from Last 3 Encounters:  01/18/24 187 lb 3.2 oz (84.9 kg)  07/17/23 181 lb 12.8 oz (82.5 kg)  12/08/22 185 lb (83.9 kg)     GEN: *** Well nourished, well developed in no acute distress HEENT: Normal NECK: No JVD; No carotid bruits LYMPHATICS: No lymphadenopathy CARDIAC: ***RRR, no murmurs, rubs, gallops RESPIRATORY:  Clear to auscultation without rales, wheezing or rhonchi  ABDOMEN: Soft, non-tender, non-distended MUSCULOSKELETAL:  No edema; No deformity  SKIN: Warm and dry NEUROLOGIC:  Alert and oriented x 3 PSYCHIATRIC:  Normal affect   ASSESSMENT:    No diagnosis found. PLAN:    Chest pain:  Hypertension: On amlodipine  10 mg daily  CKD: Baseline creatinine appears 1.3-1.5  RTC in***   Medication Adjustments/Labs and Tests Ordered: Current medicines are reviewed at length with the patient today.  Concerns regarding medicines are outlined above.  No orders of the defined types were placed in this encounter.  No orders of the defined types were placed in this encounter.   There are no Patient Instructions on file for this visit.   Signed, Lonni LITTIE Nanas, MD  04/08/2024 12:13 PM    [facility]    [1]  No outpatient medications have been marked as taking for the 04/08/24 encounter (Appointment)  with Nanas Lonni LITTIE, MD.

## 2024-04-09 ENCOUNTER — Telehealth: Payer: Self-pay

## 2024-04-09 NOTE — Telephone Encounter (Signed)
 Pt with ED visit for chest pain on 04/03/24. Recently seen cardiologist on 12/4. Pt made aware he will need to complete his ECHO that was order before we can proceed with his procedure. Pt to call back to r/s once his cardiac testing is complete. PV and colonoscopy will be cancelled for now.

## 2024-04-11 ENCOUNTER — Ambulatory Visit: Payer: Self-pay | Admitting: Internal Medicine

## 2024-04-11 ENCOUNTER — Ambulatory Visit (INDEPENDENT_AMBULATORY_CARE_PROVIDER_SITE_OTHER)

## 2024-04-11 DIAGNOSIS — M542 Cervicalgia: Secondary | ICD-10-CM | POA: Diagnosis not present

## 2024-04-11 DIAGNOSIS — R0789 Other chest pain: Secondary | ICD-10-CM | POA: Diagnosis not present

## 2024-04-12 ENCOUNTER — Encounter

## 2024-04-15 NOTE — Progress Notes (Unsigned)
 "               Odis Mace D.[facility]MENT[facility] [address] [place] [address] Phone: [phone]   Assessment and Plan:     1. Atypical chest pain (Primary) 2. Left-sided chest pain 3. Left arm pain 4. Neck pain on left side -Chronic with exacerbation, complicated, initial sports medicine visit - High concern for cardiac etiology.  Patient has been experiencing intermittent left-sided chest pain radiating into left arm, left neck over the past 6+ months, gradually increasing in frequency.  Symptoms occur randomly throughout the day. - I believe patient needs cardiology evaluation.  Cardiology establishing care visit scheduled for 04/29/2024.  Currently undergoing workup with echocardiogram completed today, pending read.  ER evaluation x 2 has been unremarkable with most recent evaluation on 04/03/2024 - Currently denies chest pain, shortness of breath.  If these symptoms present, recommend emergency room evaluation - Will start HEP for neck to decrease muscular tightness -Patient felt no improvement with prednisone  course.  With possible cardiac etiology, do not recommend prescription NSAID course   15 additional minutes spent for educating Therapeutic Home Exercise Program.  This included exercises focusing on stretching, strengthening, with focus on eccentric aspects.   Long term goals include an improvement in range of motion, strength, endurance as well as avoiding reinjury. Patient's frequency would include in 1-2 times a day, 3-5 times a week for a duration of 6-12 weeks. Proper technique shown and discussed handout in great detail with ATC.  All questions were discussed and answered.    Pertinent previous records reviewed include ER note 04/03/2024   Follow Up: As needed   Subjective:   I, Chestine Reeves, am serving as a neurosurgeon for Doctor Morene Mace  Chief Complaint: multiple issues   HPI:   04/16/2024 Patient is a 53 year old male with multiple  complaints. Patient states chest pain that radiates to the left shoulder and neck  . Pain for about a month. Prednisone  has not helped with the pain. Right sided numbness and tingling down the arm. ROM shoulder WNL. Has a burning sensation when he moves his arm. Grip strength intact.    Relevant Historical Information: Hypertension, GERD  Additional pertinent review of systems negative.  Current Medications[1]   Objective:     Vitals:   04/16/24 1419  Pulse: 94  SpO2: 99%  Weight: 178 lb (80.7 kg)  Height: 5' 5 (1.651 m)      Body mass index is 29.62 kg/m.    Physical Exam:    General: Well-appearing, cooperative, sitting comfortably in no acute distress.  HEENT: Normocephalic, atraumatic.   Neck: No gross abnormality.  Tension through bilateral trapezius, worse on left with neck ROM Cardiovascular: No pallor or cyanosis. Resp: Comfortable WOB.   Abdomen: Non distended.   Skin: Warm and dry; no focal rashes identified on limited exam. Extremities: No cyanosis or edema.  Neuro: Gross motor and sensory intact. Gait normal. Psychiatric: Mood and affect are appropriate.  MSK: Full range of motion of bilateral shoulders without pain.  No pain with palpation of cervical paraspinal, thoracic paraspinal, cervical/thoracic spinous processes, left sided rib cage, sternum  Electronically signed by:  Odis Mace D.[facility] 2:45 PM 04/16/2024     [1]  Current Outpatient Medications:    amLODipine  (NORVASC ) 10 MG tablet, TAKE 1 TABLET BY MOUTH EVERY DAY. Overdue for Annual appt must see provider for future refills, Disp: 90 tablet, Rfl: 3  fluticasone  (FLONASE ) 50 MCG/ACT nasal spray, Place 2 sprays into both nostrils daily. (Patient not taking: Reported on 03/26/2024), Disp: 16 g, Rfl: 6   indomethacin  (INDOCIN ) 50 MG capsule, Take 1 capsule (50 mg total) by mouth 2 (two) times daily with a meal., Disp: 60 capsule, Rfl: 1   pantoprazole  (PROTONIX ) 40 MG  tablet, Take 1 tablet (40 mg total) by mouth daily., Disp: 30 tablet, Rfl: 3   predniSONE  (DELTASONE ) 10 MG tablet, Prednisone  10 mg: take 4 tabs a day x 3 days; then 3 tabs a day x 4 days; then 2 tabs a day x 4 days, then 1 tab a day x 6 days, then stop. Take pc., Disp: 38 tablet, Rfl: 1  "

## 2024-04-16 ENCOUNTER — Ambulatory Visit (HOSPITAL_COMMUNITY)
Admission: RE | Admit: 2024-04-16 | Discharge: 2024-04-16 | Disposition: A | Discharge status: Home / Self Care | Source: Ambulatory Visit | Attending: Internal Medicine | Admitting: Internal Medicine

## 2024-04-16 ENCOUNTER — Telehealth: Payer: Self-pay | Admitting: Sports Medicine

## 2024-04-16 ENCOUNTER — Ambulatory Visit: Admitting: Sports Medicine

## 2024-04-16 VITALS — HR 94 | Ht 65.0 in | Wt 178.0 lb

## 2024-04-16 DIAGNOSIS — M542 Cervicalgia: Secondary | ICD-10-CM | POA: Diagnosis not present

## 2024-04-16 DIAGNOSIS — R0789 Other chest pain: Secondary | ICD-10-CM | POA: Diagnosis not present

## 2024-04-16 DIAGNOSIS — R079 Chest pain, unspecified: Secondary | ICD-10-CM | POA: Diagnosis not present

## 2024-04-16 DIAGNOSIS — M79602 Pain in left arm: Secondary | ICD-10-CM | POA: Diagnosis not present

## 2024-04-16 NOTE — Patient Instructions (Signed)
 Neck HEP  January 5th will be the soonest they can get you in, if you have new chest pain or shortness of breath go to the ED ASAP   As needed follow up if no relief after cardio visit

## 2024-04-16 NOTE — Telephone Encounter (Signed)
 Pt is planning a trip, wanted to know if Dr. Leonce was OK with him driving a 4-5 hour trip in one sitting.

## 2024-04-16 NOTE — Telephone Encounter (Signed)
 If pt calls back, I need his correct email address. He left his AVS and exercises here and I emailed to the address in EPIC, but it failed.

## 2024-04-16 NOTE — Telephone Encounter (Signed)
 Pt given Dr. Keller response to driving and confirmed pt email address.

## 2024-04-16 NOTE — Telephone Encounter (Signed)
 Left VM to give Dr.Jacksons message

## 2024-04-17 LAB — ECHOCARDIOGRAM COMPLETE
Area-P 1/2: 3.68 cm2
S' Lateral: 2.2 cm

## 2024-04-19 ENCOUNTER — Encounter: Payer: Self-pay | Admitting: Internal Medicine

## 2024-04-22 ENCOUNTER — Other Ambulatory Visit: Payer: Self-pay | Admitting: Internal Medicine

## 2024-04-22 DIAGNOSIS — R0789 Other chest pain: Secondary | ICD-10-CM

## 2024-04-23 ENCOUNTER — Other Ambulatory Visit

## 2024-04-23 DIAGNOSIS — R0789 Other chest pain: Secondary | ICD-10-CM

## 2024-04-25 LAB — H. PYLORI ANTIGEN, STOOL: H pylori Ag, Stl: NEGATIVE

## 2024-04-26 ENCOUNTER — Encounter: Admitting: Internal Medicine

## 2024-04-28 NOTE — Progress Notes (Signed)
 " Cardiology Office Note:   Date:  04/28/2024  ID:  Thomas Mcgee, DOB 08/03/1970, MRN [id number] PCP: Garald Karlynn GAILS, MD  [facility] Providers Cardiologist:  None { Chief Complaint: No chief complaint on file.     History of Present Illness:   Thomas Mcgee is a 54 y.o. male with a PMH of HTN and GERD who presents as a new patient referral by Dr Garald for the evaluation of chest pain.  Patient was previously seen by a cardiologist on 12//25 for arm pain.  Cardiovascular workup was not pursued due to the atypical nature of the pain.  He underwent TTE on 04/16/2024 which showed preserved LV/RV systolic function and just grade 1 DD.  CT PE was performed which was negative for PE and was only notable for borderline cardiomegaly.  On 12/10 the patient presented to the [facility] health ED for similar chest pain.  Troponins ruled out and EKG was nonischemic.  Prior workup included an echo stress test in 2020 which was negative.   Past Medical History:  Diagnosis Date   Decreased GFR    Hypertension      Studies Reviewed:    EKG: ***       Cardiac Studies & Procedures   ______________________________________________________________________________________________     ECHOCARDIOGRAM  ECHOCARDIOGRAM COMPLETE 04/16/2024  Narrative ECHOCARDIOGRAM REPORT    Patient Name:   Thomas Mcgee Date of Exam: 04/16/2024 Medical Rec #:  [id number]       Height:       65.0 in Accession #:    [id number]      Weight:       187.2 lb Date of Birth:  1970/08/25        BSA:          1.923 m Patient Age:    53 years        BP:           115/92 mmHg Patient Gender: M               HR:           88 bpm. Exam Location:  [address]  Procedure: 2D Echo, Cardiac Doppler, Color Doppler and 3D Echo (Both Spectral and Color Flow Doppler were utilized during procedure).  Indications:    Chest Pain R07.9  History:        Patient has no prior history of Echocardiogram  examinations. Risk Factors:Hypertension.  Sonographer:    Augustin Seals RDCS Referring Phys: [id number] KARLYNN GAILS PLOTNIKOV   Sonographer Comments: Global longitudinal strain was attempted. IMPRESSIONS   1. Left ventricular ejection fraction, by estimation, is 55 to 60%. Left ventricular ejection fraction by 3D volume is 61 %. The left ventricle has normal function. The left ventricle has no regional wall motion abnormalities. There is mild concentric left ventricular hypertrophy. Left ventricular diastolic parameters are consistent with Grade I diastolic dysfunction (impaired relaxation). 2. Right ventricular systolic function is normal. The right ventricular size is normal. There is normal pulmonary artery systolic pressure. 3. The mitral valve is normal in structure. Trivial mitral valve regurgitation. No evidence of mitral stenosis. 4. The aortic valve is tricuspid. There is mild calcification of the aortic valve. Aortic valve regurgitation is not visualized. Aortic valve sclerosis/calcification is present, without any evidence of aortic stenosis. 5. The inferior vena cava is normal in size with greater than 50% respiratory variability, suggesting right atrial pressure of 3 mmHg.  FINDINGS Left Ventricle: Left ventricular ejection fraction, by estimation,  is 55 to 60%. Left ventricular ejection fraction by 3D volume is 61 %. The left ventricle has normal function. The left ventricle has no regional wall motion abnormalities. The left ventricular internal cavity size was normal in size. There is mild concentric left ventricular hypertrophy. Left ventricular diastolic parameters are consistent with Grade I diastolic dysfunction (impaired relaxation).  Right Ventricle: The right ventricular size is normal. No increase in right ventricular wall thickness. Right ventricular systolic function is normal. There is normal pulmonary artery systolic pressure. The tricuspid regurgitant velocity is 1.97  m/s, and with an assumed right atrial pressure of 3 mmHg, the estimated right ventricular systolic pressure is 18.5 mmHg.  Left Atrium: Left atrial size was normal in size.  Right Atrium: Right atrial size was normal in size.  Pericardium: There is no evidence of pericardial effusion.  Mitral Valve: The mitral valve is normal in structure. Trivial mitral valve regurgitation. No evidence of mitral valve stenosis.  Tricuspid Valve: The tricuspid valve is normal in structure. Tricuspid valve regurgitation is mild . No evidence of tricuspid stenosis.  Aortic Valve: The aortic valve is tricuspid. There is mild calcification of the aortic valve. Aortic valve regurgitation is not visualized. Aortic valve sclerosis/calcification is present, without any evidence of aortic stenosis.  Pulmonic Valve: The pulmonic valve was normal in structure. Pulmonic valve regurgitation is trivial. No evidence of pulmonic stenosis.  Aorta: The aortic root is normal in size and structure.  Venous: The inferior vena cava is normal in size with greater than 50% respiratory variability, suggesting right atrial pressure of 3 mmHg.  IAS/Shunts: No atrial level shunt detected by color flow Doppler.  Additional Comments: 3D was performed not requiring image post processing on an independent workstation and was normal.   LEFT VENTRICLE PLAX 2D LVIDd:         3.50 cm         Diastology LVIDs:         2.20 cm         LV e' medial:    6.31 cm/s LV PW:         1.10 cm         LV E/e' medial:  8.0 LV IVS:        1.30 cm         LV e' lateral:   10.80 cm/s LVOT diam:     2.40 cm         LV E/e' lateral: 4.7 LV SV:         62 LV SV Index:   32 LVOT Area:     4.52 cm        3D Volume EF LV 3D EF:    Left ventricul ar ejection fraction by 3D volume is 61 %.  3D Volume EF: 3D EF:        61 % LV EDV:       118 ml LV ESV:       45 ml LV SV:        72 ml  RIGHT VENTRICLE RV Basal diam:  3.70 cm RV Mid diam:     3.20 cm RV S prime:     11.70 cm/s TAPSE (M-mode): 2.1 cm  LEFT ATRIUM             Index        RIGHT ATRIUM           Index LA diam:        3.50  cm 1.82 cm/m   RA Area:     16.60 cm LA Vol (A2C):   17.5 ml 9.10 ml/m   RA Volume:   52.20 ml  27.14 ml/m LA Vol (A4C):   36.0 ml 18.72 ml/m LA Biplane Vol: 25.4 ml 13.21 ml/m AORTIC VALVE LVOT Vmax:   79.00 cm/s LVOT Vmean:  52.000 cm/s LVOT VTI:    0.137 m  AORTA Ao Root diam: 3.50 cm Ao Asc diam:  3.30 cm  MITRAL VALVE               TRICUSPID VALVE MV Area (PHT): 3.68 cm    TR Peak grad:   15.5 mmHg MV Decel Time: 206 msec    TR Vmax:        197.00 cm/s MV E velocity: 50.70 cm/s MV A velocity: 57.70 cm/s  SHUNTS MV E/A ratio:  0.88        Systemic VTI:  0.14 m Systemic Diam: 2.40 cm  Toribio Fuel MD Electronically signed by Toribio Fuel MD Signature Date/Time: 04/17/2024/3:05:00 AM    Final          ______________________________________________________________________________________________      Risk Assessment/Calculations:   {Does this patient have ATRIAL FIBRILLATION?:[id number]} No BP recorded.  {Refresh Note OR Click here to enter BP  :1}***        Physical Exam:     VS:  There were no vitals taken for this visit. ***    Wt Readings from Last 3 Encounters:  04/16/24 178 lb (80.7 kg)  01/18/24 187 lb 3.2 oz (84.9 kg)  07/17/23 181 lb 12.8 oz (82.5 kg)     GEN: Well nourished, well developed, in no acute distress NECK: No JVD; No carotid bruits CARDIAC: ***RRR, no murmurs, rubs, gallops RESPIRATORY:  Clear to auscultation without rales, wheezing or rhonchi  ABDOMEN: Soft, non-tender, non-distended, normal bowel sounds EXTREMITIES:  Warm and well perfused, no edema; No deformity, 2+ radial pulses PSYCH: Normal mood and affect   Assessment & Plan       {Are you ordering a CV Procedure (e.g. stress test, cath, DCCV, TEE, etc)?   Press F2        :[id number]}   This note was written  with the assistance of a dictation microphone or AI dictation software. Please excuse any typos or grammatical errors.   Signed, Georganna Archer, MD 04/28/2024 8:27 PM    [facility]  "

## 2024-04-29 ENCOUNTER — Ambulatory Visit: Admitting: Student in an Organized Health Care Education/Training Program

## 2024-04-29 ENCOUNTER — Emergency Department (HOSPITAL_COMMUNITY)

## 2024-04-29 ENCOUNTER — Observation Stay (HOSPITAL_COMMUNITY)
Admission: EM | Admit: 2024-04-29 | Discharge: 2024-04-30 | Disposition: A | Discharge status: Home / Self Care | Attending: Emergency Medicine | Admitting: Emergency Medicine

## 2024-04-29 ENCOUNTER — Encounter: Payer: Self-pay | Admitting: Student in an Organized Health Care Education/Training Program

## 2024-04-29 ENCOUNTER — Other Ambulatory Visit: Payer: Self-pay

## 2024-04-29 VITALS — BP 88/52 | HR 84 | Ht 65.0 in | Wt 177.2 lb

## 2024-04-29 DIAGNOSIS — I200 Unstable angina: Secondary | ICD-10-CM | POA: Diagnosis not present

## 2024-04-29 DIAGNOSIS — R072 Precordial pain: Principal | ICD-10-CM | POA: Insufficient documentation

## 2024-04-29 DIAGNOSIS — I10 Essential (primary) hypertension: Secondary | ICD-10-CM | POA: Insufficient documentation

## 2024-04-29 DIAGNOSIS — R079 Chest pain, unspecified: Secondary | ICD-10-CM | POA: Diagnosis present

## 2024-04-29 DIAGNOSIS — Z79899 Other long term (current) drug therapy: Secondary | ICD-10-CM | POA: Insufficient documentation

## 2024-04-29 LAB — CBC
HCT: 43.2 % (ref 39.0–52.0)
Hemoglobin: 14.7 g/dL (ref 13.0–17.0)
MCH: 30.1 pg (ref 26.0–34.0)
MCHC: 34 g/dL (ref 30.0–36.0)
MCV: 88.5 fL (ref 80.0–100.0)
Platelets: 167 K/uL (ref 150–400)
RBC: 4.88 MIL/uL (ref 4.22–5.81)
RDW: 13.4 % (ref 11.5–15.5)
WBC: 4.8 K/uL (ref 4.0–10.5)
nRBC: 0 % (ref 0.0–0.2)

## 2024-04-29 LAB — TROPONIN T, HIGH SENSITIVITY
Troponin T High Sensitivity: <15 ng/L (ref 0–19)
Troponin T High Sensitivity: <15 ng/L (ref 0–19)

## 2024-04-29 LAB — BASIC METABOLIC PANEL WITH GFR
Anion gap: 9 (ref 5–15)
BUN: 13 mg/dL (ref 6–20)
CO2: 26 mmol/L (ref 22–32)
Calcium: 9.2 mg/dL (ref 8.9–10.3)
Chloride: 101 mmol/L (ref 98–111)
Creatinine, Ser: 1.36 mg/dL — ABNORMAL HIGH (ref 0.61–1.24)
GFR, Estimated: >60 mL/min
Glucose, Bld: 108 mg/dL — ABNORMAL HIGH (ref 70–99)
Potassium: 4.5 mmol/L (ref 3.5–5.1)
Sodium: 137 mmol/L (ref 135–145)

## 2024-04-29 LAB — MAGNESIUM: Magnesium: 1.7 mg/dL (ref 1.7–2.4)

## 2024-04-29 MED ORDER — ACETAMINOPHEN 325 MG PO TABS: 650.0000 mg | ORAL_TABLET | ORAL | Status: DC | PRN

## 2024-04-29 MED ORDER — IVABRADINE HCL 5 MG PO TABS: 15.0000 mg | ORAL_TABLET | ORAL | Status: DC | PRN

## 2024-04-29 MED ORDER — AMLODIPINE BESYLATE 5 MG PO TABS: ORAL_TABLET | ORAL | 3 refills | Status: DC

## 2024-04-29 MED ORDER — ONDANSETRON HCL 4 MG/2ML IJ SOLN: 4.0000 mg | Freq: Four times a day (QID) | INTRAMUSCULAR | Status: DC | PRN

## 2024-04-29 NOTE — Assessment & Plan Note (Signed)
-   Blood pressure is low today on his amlodipine .  He states that is not normally this low. Decrease amlodipine  to 5 mg daily

## 2024-04-29 NOTE — ED Provider Notes (Signed)
 " [place] [facility] AT [facility] Provider Note   CSN: [id number] Arrival date & time: 04/29/24  1119     Patient presents with: Chest Pain   Thomas Mcgee is a 54 y.o. male.    Chest Pain      Prior to Admission medications  Medication Sig Start Date End Date Taking? Authorizing Provider  amLODipine  (NORVASC ) 10 MG tablet Take 10 mg by mouth daily.   Yes [provider]  esomeprazole (NEXIUM 24HR) 20 MG capsule Take 20 mg by mouth daily as needed (hearburn, indigestion).   Yes [provider]  MAGNESIUM CITRATE PO Take 1 each by mouth daily after breakfast. OTC; Magnesium Citrate gummy   Yes [provider]  Specialty Vitamins Products (NERVIVE NERVE RELIEF PO) Take 1 tablet by mouth daily.   Yes [provider]  indomethacin  (INDOCIN ) 50 MG capsule Take 1 capsule (50 mg total) by mouth 2 (two) times daily with a meal. Patient not taking: Reported on 04/29/2024 04/08/24   Plotnikov, Karlynn GAILS, MD  pantoprazole  (PROTONIX ) 40 MG tablet Take 1 tablet (40 mg total) by mouth daily. Patient not taking: Reported on 04/29/2024 03/26/24   Plotnikov, Karlynn GAILS, MD  predniSONE  (DELTASONE ) 10 MG tablet Prednisone  10 mg: take 4 tabs a day x 3 days; then 3 tabs a day x 4 days; then 2 tabs a day x 4 days, then 1 tab a day x 6 days, then stop. Take pc. Patient not taking: Reported on 04/29/2024 03/31/24   Plotnikov, Aleksei V, MD    Allergies: Patient has no known allergies.    Review of Systems  Cardiovascular:  Positive for chest pain.    Updated Vital Signs BP (!) 114/90   Pulse 77   Temp 97.8 F (36.6 C)   Resp 19   SpO2 100%   Physical Exam Vitals and nursing note reviewed.  Constitutional:      Appearance: Normal appearance.  HENT:     Head: Normocephalic and atraumatic.     Mouth/Throat:     Mouth: Mucous membranes are moist.  Eyes:     General: No scleral icterus.       Right eye: No discharge.        Left eye: No  discharge.     Conjunctiva/sclera: Conjunctivae normal.  Cardiovascular:     Rate and Rhythm: Normal rate and regular rhythm.     Pulses: Normal pulses.  Pulmonary:     Effort: Pulmonary effort is normal.     Breath sounds: Normal breath sounds.  Abdominal:     General: There is no distension.     Tenderness: There is no abdominal tenderness.  Musculoskeletal:        General: No deformity.     Cervical back: Normal range of motion.  Skin:    General: Skin is warm and dry.     Capillary Refill: Capillary refill takes less than 2 seconds.  Neurological:     Mental Status: He is alert.     Motor: No weakness.  Psychiatric:        Mood and Affect: Mood normal.     (all labs ordered are listed, but only abnormal results are displayed) Labs Reviewed  BASIC METABOLIC PANEL WITH GFR - Abnormal; Notable for the following components:      Result Value   Glucose, Bld 108 (*)    Creatinine, Ser 1.36 (*)    All other components within normal limits  CBC  TROPONIN T, HIGH SENSITIVITY  TROPONIN T, HIGH SENSITIVITY    EKG: None  Radiology: DG Chest 2 View Result Date: 04/29/2024 CLINICAL DATA:  Left-sided chest pain radiating to left arm. EXAM: CHEST - 2 VIEW COMPARISON:  04/03/2024 FINDINGS: The heart size and mediastinal contours are within normal limits. Both lungs are clear. The visualized skeletal structures are unremarkable. IMPRESSION: No active cardiopulmonary disease. Electronically Signed   By: Norleen DELENA Kil M.D.   On: 04/29/2024 13:10   Procedures   Medications Ordered in the ED - No data to display                                 Medical Decision Making Amount and/or Complexity of Data Reviewed Labs: ordered. Radiology: ordered.   This patient presents to the ED for concern of chest pain, this involves an extensive number of treatment options, and is a complaint that carries with it a high risk of complications and morbidity.   Differential diagnosis includes: ACS,  GERD, pulmonary embolism, pneumonia, pneumothorax, CHF, COPD  Co morbidities:  hypertension, GERD   Additional history: Patient was being evaluated by his cardiologist, Dr. Floretta earlier today when he was complaining of chest pain.  Dr. Sellon had concern that this may have been related to ACS, and patient should report to the emergency department for further workup as well as coronary CT angio.  Lab Tests:  I Ordered, and personally interpreted labs.  The pertinent results include: No acute abnormalities to explain patient's symptoms.  Initial troponin less than 15.  Delta troponin pending.  Imaging Studies:  I ordered imaging studies including chest x-ray I independently visualized and interpreted imaging which showed no acute cardiopulmonary abnormality I agree with the radiologist interpretation  Cardiac Monitoring/ECG:  The patient was maintained on a cardiac monitor.  I personally viewed and interpreted the cardiac monitored which showed an underlying rhythm of: Sinus rhythm  Medicines ordered and prescription drug management: No medication indicated at this time  Test Considered:   none  Critical Interventions:   none  Consultations Obtained: Cardiology  Problem List / ED Course:     ICD-10-CM   1. Precordial pain  R07.2       MDM: 54 year old male who presents to the emergency department for evaluation of chest pain.  Patient was being evaluated by his cardiologist earlier this morning, when he reported left-sided chest pain with occasional radiation to his left upper extremity.  Patient states this is been ongoing intermittently for the last 2 months, however his cardiologist was adamant that patient needed a coronary CT angiogram.  This is not a test that is typically performed in the emergency department, so patient will be admitted to cardiology for this.  His additional workup was unremarkable.  Initial troponin was less than 15, delta troponin pending.  I  spoke with the NP from cardiology, who agreed to be primary for this patient and they will arrange for him to obtain the coronary CT angio.   Dispostion:  After consideration of the diagnostic results and the patients response to treatment, I feel that the patient would benefit from admission with cardiology.    Final diagnoses:  Precordial pain    ED Discharge Orders     None          Torrence Marry GORMAN DEVONNA 04/29/24 1500    Freddi Hamilton, MD 04/30/24 [id number]  "

## 2024-04-29 NOTE — Patient Instructions (Addendum)
 Sent to Emergency Room   MEDICATION: Decrease amlodipine  to 5 mg daily   Follow-Up: At [facility], you and your health needs are our priority.  As part of our continuing mission to provide you with exceptional heart care, our providers are all part of one team.  This team includes your primary Cardiologist (physician) and Advanced Practice Providers or APPs (Physician Assistants and Nurse Practitioners) who all work together to provide you with the care you need, when you need it.  Your next appointment:   1 months  Provider:   Georganna Archer, MD

## 2024-04-29 NOTE — H&P (Signed)
 "  Cardiology Admission History and Physical  Patient ID: Thomas Mcgee MRN: [id number]; DOB: 1970/08/03   Admission date: 04/29/2024  PCP:  Garald Karlynn GAILS, MD   [facility] Providers Cardiologist:  Georganna Archer, MD     Chief Complaint:  chest pain  Patient Profile: Thomas Mcgee is a 54 y.o. male with hypertension, GERD, recurrent chest pain who is being seen 04/29/2024 for the evaluation of chest pain.  History of Present Illness: Thomas Mcgee has past medical history as stated above.  He was seen today in clinic by Dr. Archer for an evaluation of chest pain.  He notes that he has presented to the emergency department various times for chest pain, EKG usually nonischemic and troponins typically negative.  He has undergone an echocardiogram most recently performed by [facility] December 2025 that showed preserved LV/RV systolic function, G1 DD.  He had also undergone an echo stress test in 2020 which was negative.  He reported today in clinic that he was having active chest pain, saying it started last night and woke him from his sleep.  He reported that it was left-sided, radiating to his arm as well as his jaw.  He is unable to determine if it is exertional as he does not frequently exercise.  He denies anything making it better or worse.  Denied any associated shortness of breath, PND, orthopnea, swelling, nausea, vomiting, diaphoresis.  He denied any tobacco and illicit drug use, noting that he only drinks socially.  Dr. Archer recommended further evaluation in the emergency department, with recommendations for coronary CTA upon arrival.  Relevant workup while in the ED includes: Troponin negative x 2, CBC normal, BMP shows creatinine 1.36 (appears around baseline). CXR showed no active disease.   He was seen by cardiology through [facility] on 03/28/2024 for left arm pain.  He had reported sharp left arm pain that have been ongoing for about 6 months,  intermittently.  He had a CT of his chest done which showed borderline cardiomegaly which resulted in him being referred to cardiology.  He has underwent a echocardiogram 03/2024 which showed preserved LV/RV systolic function and just G1DD.   After speaking with the patient, he agrees with the history as stated above. He tells me that is has had this intermittent chest pain that has been on going for a few months now. He believes it got worse over night, which awoke him from sleep. We discussed observation admission, obtaining a CCTA in the morning, and then treatment plan to follow results of that. He is a little stressed as he left important travel documents and his lunch at work but I discussed that he would have to stay overnight to have the CCTA performed in the morning and he is agreeable.   Past Medical History:  Diagnosis Date   Decreased GFR    Hypertension    No past surgical history on file.   Medications Prior to Admission: Prior to Admission medications  Medication Sig Start Date End Date Taking? Authorizing Provider  amLODipine  (NORVASC ) 10 MG tablet Take 10 mg by mouth daily.   Yes [provider]  esomeprazole (NEXIUM 24HR) 20 MG capsule Take 20 mg by mouth daily as needed (hearburn, indigestion).   Yes [provider]  MAGNESIUM CITRATE PO Take 1 each by mouth daily after breakfast. OTC; Magnesium Citrate gummy   Yes [provider]  Specialty Vitamins Products (NERVIVE NERVE RELIEF PO) Take 1 tablet by mouth daily.  Yes [provider]  indomethacin  (INDOCIN ) 50 MG capsule Take 1 capsule (50 mg total) by mouth 2 (two) times daily with a meal. Patient not taking: Reported on 04/29/2024 04/08/24   Plotnikov, Karlynn GAILS, MD  pantoprazole  (PROTONIX ) 40 MG tablet Take 1 tablet (40 mg total) by mouth daily. Patient not taking: Reported on 04/29/2024 03/26/24   Plotnikov, Karlynn GAILS, MD  predniSONE  (DELTASONE ) 10 MG tablet Prednisone  10 mg: take 4 tabs a  day x 3 days; then 3 tabs a day x 4 days; then 2 tabs a day x 4 days, then 1 tab a day x 6 days, then stop. Take pc. Patient not taking: Reported on 04/29/2024 03/31/24   Plotnikov, Karlynn GAILS, MD    Allergies:   Allergies[1]  Social History:   Social History   Socioeconomic History   Marital status: Single    Spouse name: Not on file   Number of children: Not on file   Years of education: Not on file   Highest education level: Associate degree: academic program  Occupational History   Not on file  Tobacco Use   Smoking status: Never   Smokeless tobacco: Never  Vaping Use   Vaping status: Never Used  Substance and Sexual Activity   Alcohol use: Yes   Drug use: Never   Sexual activity: Yes    Birth control/protection: None  Other Topics Concern   Not on file  Social History Narrative   Not on file   Social Drivers of Health   Tobacco Use: Low Risk (04/29/2024)   Patient History    Smoking Tobacco Use: Never    Smokeless Tobacco Use: Never    Passive Exposure: Not on file  Financial Resource Strain: Patient Declined (03/23/2024)   Overall Financial Resource Strain (CARDIA)    Difficulty of Paying Living Expenses: Patient declined  Food Insecurity: Low Risk (03/28/2024)   Received from [facility]   Epic    Within the past 12 months, you worried that your food would run out before you got money to buy more: Never true    Within the past 12 months, the food you bought just didn't last and you didn't have money to get more. : Never true  Transportation Needs: No Transportation Needs (03/28/2024)   Received from [facility]    In the past 12 months, has lack of reliable transportation kept you from medical appointments, meetings, work or from getting things needed for daily living? : No  Physical Activity: Insufficiently Active (03/23/2024)   Exercise Vital Sign    Days of Exercise per Week: 2 days    Minutes of Exercise per Session: 30 min  Stress: No  Stress Concern Present (03/23/2024)   [employer] of Occupational Health - Occupational Stress Questionnaire    Feeling of Stress: Only a little  Social Connections: Socially Integrated (03/23/2024)   Social Connection and Isolation Panel    Frequency of Communication with Friends and Family: More than three times a week    Frequency of Social Gatherings with Friends and Family: Once a week    Attends Religious Services: More than 4 times per year    Active Member of [employer] or Organizations: Yes    Attends Banker Meetings: More than 4 times per year    Marital Status: Married  Intimate Partner Violence: Unknown (07/29/2021)   Received from [facility]   HITS    Physically Hurt: Not on file  Insult or Talk Down To: Not on file    Threaten Physical Harm: Not on file    Scream or Curse: Not on file  Depression (PHQ2-9): Low Risk (12/08/2022)   Depression (PHQ2-9)    PHQ-2 Score: 0  Alcohol Screen: Low Risk (03/23/2024)   Alcohol Screen    Last Alcohol Screening Score (AUDIT): 1  Housing: Low Risk (03/28/2024)   Received from [facility]   Epic    What is your living situation today?: I have a steady place to live    Think about the place you live. Do you have problems with any of the following? Choose all that apply:: None/None on this list  Utilities: Low Risk (03/28/2024)   Received from [facility]   Utilities    In the past 12 months has the electric, gas, oil, or water company threatened to shut off services in your home? : No  Health Literacy: Not on file    Family History:   The patient's family history is not on file.    ROS:  Please see the history of present illness.  All other ROS reviewed and negative.     Physical Exam/Data: Vitals:   04/29/24 1122 04/29/24 1442  BP: 108/83 (!) 114/90  Pulse: 81 77  Resp: 14 19  Temp: 97.6 F (36.4 C) 97.8 F (36.6 C)  SpO2: 99% 100%   No intake or output data in the 24 hours ending 04/29/24  1622    04/29/2024    9:37 AM 04/16/2024    2:19 PM 01/18/2024    7:42 AM  Last 3 Weights  Weight (lbs) 177 lb 3.2 oz 178 lb 187 lb 3.2 oz  Weight (kg) 80.377 kg 80.74 kg 84.913 kg     There is no height or weight on file to calculate BMI.   General:  Well nourished, well developed, in no acute distress HEENT: normal Neck: no JVD Vascular: No carotid bruits; Distal pulses 2+ bilaterally   Cardiac:  normal S1, S2; RRR; no murmur  Lungs:  clear to auscultation bilaterally Abd: soft, nontender, no hepatomegaly  Ext: no edema Musculoskeletal:  No deformities Skin: warm and dry  Neuro:  no focal abnormalities noted Psych:  Normal affect   EKG:  The ECG that was done 04/29/2024 was personally reviewed and demonstrates NSR  Relevant CV Studies:  Echocardiogram, 04/16/2024 Left ventricular ejection fraction, by estimation, is 55 to 60% . Left ventricular ejection fraction by 3D volume is 61 % . The left ventricle has normal function. The left ventricle has no regional wall motion abnormalities. There is mild concentric left ventricular hypertrophy. Left ventricular diastolic parameters are consistent with Grade I diastolic dysfunction ( impaired relaxation) .  Right ventricular systolic function is normal. The right ventricular size is normal. There is normal pulmonary artery systolic pressure.  The mitral valve is normal in structure. Trivial mitral valve regurgitation. No evidence of mitral stenosis.  The aortic valve is tricuspid. There is mild calcification of the aortic valve. Aortic valve regurgitation is not visualized. Aortic valve sclerosis/ calcification is present, without any evidence of aortic stenosis.  The inferior vena cava is normal in size with greater than 50% respiratory variability, suggesting right atrial pressure of 3 mmHg.  Laboratory Data: High Sensitivity Troponin:  No results for input(s): TROPONINIHS in the last 720 hours.  Recent Labs  Lab 04/03/24 2001  04/29/24 1220 04/29/24 1447  TRNPT <15 <15 <15        Chemistry Recent  Labs  Lab 04/29/24 1220  NA 137  K 4.5  CL 101  CO2 26  GLUCOSE 108*  BUN 13  CREATININE 1.36*  CALCIUM 9.2  GFRNONAA >60  ANIONGAP 9    No results for input(s): PROT, ALBUMIN, AST, ALT, ALKPHOS, BILITOT in the last 168 hours. Lipids No results for input(s): CHOL, TRIG, HDL, LABVLDL, LDLCALC, CHOLHDL in the last 168 hours. Hematology Recent Labs  Lab 04/29/24 1220  WBC 4.8  RBC 4.88  HGB 14.7  HCT 43.2  MCV 88.5  MCH 30.1  MCHC 34.0  RDW 13.4  PLT 167   Thyroid  No results for input(s): TSH, FREET4 in the last 168 hours. BNPNo results for input(s): BNP, PROBNP in the last 168 hours.  DDimer No results for input(s): DDIMER in the last 168 hours.  Radiology/Studies:  DG Chest 2 View Result Date: 04/29/2024 CLINICAL DATA:  Left-sided chest pain radiating to left arm. EXAM: CHEST - 2 VIEW COMPARISON:  04/03/2024 FINDINGS: The heart size and mediastinal contours are within normal limits. Both lungs are clear. The visualized skeletal structures are unremarkable. IMPRESSION: No active cardiopulmonary disease. Electronically Signed   By: Norleen DELENA Kil M.D.   On: 04/29/2024 13:10   Assessment and Plan:  Chest pain Presented to clinic 04/29/2024 for chest pain that awoke him from  sleep Sent over from clinic, plans for coronary CTA tomorrow 04/30/2024 Troponin negative x 2 EKG showed sinus rhythm, no acute changes when compared to prior  Patient denies any active chest pain, does admit the plan comes and goes  CXR normal  Plan for coronary CTA tomorrow 04/30/2024, cardiac imaging team aware and arranging scheduling  Order placement of 18g IV in the [facility] or higher  Will order pre-medications, plan for ivabradine  15 mg to be given prior to scan, ordered as PRN -- administration to be determine by RN navigator prior to scan   Hypertension Noted to have BP 88/52 in clinic, plans  to reduce amlodipine  Will hold amlodipine  to ensure there is BP room for pre-medication tomorrow  Plan to discharge at newly reduced amlodipine  5 mg daily if tolerating   Risk Assessment/Risk Scores:  Code Status: Full Code  Severity of Illness: The appropriate patient status for this patient is OBSERVATION. Observation status is judged to be reasonable and necessary in order to provide the required intensity of service to ensure the patient's safety. The patient's presenting symptoms, physical exam findings, and initial radiographic and laboratory data in the context of their medical condition is felt to place them at decreased risk for further clinical deterioration. Furthermore, it is anticipated that the patient will be medically stable for discharge from the hospital within 2 midnights of admission.    For questions or updates, please contact [facility] Please consult [email or web address] for contact info under   Signed, Waddell DELENA Donath, PA-C  04/29/2024 4:22 PM      [1] No Known Allergies  "

## 2024-04-29 NOTE — ED Triage Notes (Signed)
 Pt sent from heart care with c/o ongoing left sided chest pain, radiating into left arm for 2 months.

## 2024-04-29 NOTE — ED Provider Triage Note (Signed)
 Emergency Medicine Provider Triage Evaluation Note  Thomas Mcgee , a 54 y.o. male  was evaluated in triage.  Pt complains of chest pain which has been going on for over a month, randomly, feels it every day, radiates into his neck and his shoulder and his arm.  He is actively having mild pain at this time, he recently had a fever and a cough over the weekend, that is not that bad at this time, no swelling of the legs, no exertional symptoms, sent from the cardiology office today for further evaluation with labs.  He was seen at the cardiology office at the request of his family doctor who sent him there because of chest pain.  He has not had any prior cardiac testing  Review of Systems  Positive: Chest pain Negative: Swelling of the legs  Physical Exam  BP 108/83   Pulse 81   Temp 97.6 F (36.4 C)   Resp 14   SpO2 99%  Gen:   Awake, no distress   Resp:  Normal effort  MSK:   Moves extremities without difficulty  Other:  Well-appearing, no acute findings on cardiac or pulmonary exam  Medical Decision Making  Medically screening exam initiated at 11:56 AM.  Appropriate orders placed.  Thomas Mcgee was informed that the remainder of the evaluation will be completed by another provider, this initial triage assessment does not replace that evaluation, and the importance of remaining in the ED until their evaluation is complete.  Chest pain workup, low risk, 54 years old, on amlodipine  only, anticipate 2 troponins and back to cardiology if negative  EKG performed at 11:40 AM shows sinus rhythm 79 bpm with slight leftward axis, normal ST segments normal T waves, normal intervals, no LVH.   Cleotilde Rogue, MD 04/29/24 1159

## 2024-04-30 ENCOUNTER — Observation Stay (HOSPITAL_COMMUNITY)

## 2024-04-30 DIAGNOSIS — R072 Precordial pain: Secondary | ICD-10-CM

## 2024-04-30 LAB — BASIC METABOLIC PANEL WITH GFR
Anion gap: 10 (ref 5–15)
BUN: 14 mg/dL (ref 6–20)
CO2: 25 mmol/L (ref 22–32)
Calcium: 9.6 mg/dL (ref 8.9–10.3)
Chloride: 101 mmol/L (ref 98–111)
Creatinine, Ser: 1.5 mg/dL — ABNORMAL HIGH (ref 0.61–1.24)
GFR, Estimated: 56 mL/min — ABNORMAL LOW
Glucose, Bld: 121 mg/dL — ABNORMAL HIGH (ref 70–99)
Potassium: 4.5 mmol/L (ref 3.5–5.1)
Sodium: 137 mmol/L (ref 135–145)

## 2024-04-30 LAB — CBC
HCT: 42.2 % (ref 39.0–52.0)
Hemoglobin: 14.5 g/dL (ref 13.0–17.0)
MCH: 30.3 pg (ref 26.0–34.0)
MCHC: 34.4 g/dL (ref 30.0–36.0)
MCV: 88.3 fL (ref 80.0–100.0)
Platelets: 180 K/uL (ref 150–400)
RBC: 4.78 MIL/uL (ref 4.22–5.81)
RDW: 13.5 % (ref 11.5–15.5)
WBC: 5.3 K/uL (ref 4.0–10.5)
nRBC: 0 % (ref 0.0–0.2)

## 2024-04-30 MED ORDER — IOHEXOL 350 MG/ML SOLN
190.0000 mL | Freq: Once | INTRAVENOUS | Status: AC | PRN
  Administered 2024-04-30: 190 mL via INTRAVENOUS

## 2024-04-30 MED ORDER — NITROGLYCERIN 0.4 MG SL SUBL
SUBLINGUAL_TABLET | SUBLINGUAL | Status: AC
  Filled 2024-04-30: qty 2

## 2024-04-30 MED ORDER — AMLODIPINE BESYLATE 5 MG PO TABS: 5.0000 mg | ORAL_TABLET | Freq: Every day | ORAL | 11 refills | Status: AC

## 2024-04-30 MED ORDER — NITROGLYCERIN 0.4 MG SL SUBL
0.8000 mg | SUBLINGUAL_TABLET | Freq: Once | SUBLINGUAL | Status: AC
  Administered 2024-04-30: 0.8 mg via SUBLINGUAL

## 2024-04-30 NOTE — Progress Notes (Addendum)
"  °  Progress Note  Patient Name: Thomas Mcgee Date of Encounter: 04/30/2024 [facility] Cardiologist: Georganna Archer, MD   Interval Summary   Denies any ongoing chest pain.  Reported that he had several episodes of left-sided chest pain overnight.  The pain radiates to his left arm and neck. Was unable to identify anything that made the pain better or worse.  Denied that the pain was associated with inspiration, exertion, or position.  Denies shortness of breath, lower extremity edema, abdominal distention, nausea, vomiting, or diaphoresis.  Vital Signs Vitals:   04/30/24 0025 04/30/24 0420 04/30/24 0425 04/30/24 0600  BP: (!) 89/66 111/87  104/73  Pulse: 71 64  77  Resp: 16 17  16   Temp:   98.4 F (36.9 C)   TempSrc:   Oral   SpO2: 100% 100%  100%   No intake or output data in the 24 hours ending 04/30/24 0928    04/29/2024    9:37 AM 04/16/2024    2:19 PM 01/18/2024    7:42 AM  Last 3 Weights  Weight (lbs) 177 lb 3.2 oz 178 lb 187 lb 3.2 oz  Weight (kg) 80.377 kg 80.74 kg 84.913 kg      Telemetry/ECG  Normal sinus rhythm with heart rates in the 60s- Personally Reviewed  Physical Exam  GEN: No acute distress.  Alert and orientated on room air Neck: No JVD Cardiac: RRR, no murmurs, rubs, or gallops.  Respiratory: Clear to auscultation bilaterally. GI: Soft, nontender, non-distended  MS: No edema  Assessment & Plan  Thomas Mcgee is a 54 y.o. male with hypertension, GERD, recurrent chest pain.  Chest pain Presented to the clinic on 04/29/2024 for chest pain that woke him up out of his sleep.  Was sent to the hospital for plans for a coronary CT. Labs showed high-sensitivity troponins negative x 2. EKG showed no acute ischemic changes. TTE on 03/2024 showed a normal LVEF of 55 to 60%, G1 DD, normal RV systolic function, and mild aortic valve calcification without any evidence of stenosis. Coronary CTA interpretation pending.  Patient has a outpatient follow-up  with Dr. Archer on 06/06/2024.   HTN Well-controlled most recent BP was 107/76.  Prior to admission was on amlodipine  10 mg daily.     For questions or updates, please contact [facility] Please consult [email or web address] for contact info under     Signed, Morse Clause, PA-C   Patient seen and examined, note reviewed with the signed Advanced Practice Provider. I personally reviewed laboratory data, imaging studies and relevant notes. I independently examined the patient and formulated the important aspects of the plan. I have personally discussed the plan with the patient and/or family. Comments or changes to the note/plan are indicated below.  Chest pain Hypertension  Review of coronary CTA no evidence of any coronary artery disease essentially normal.  Troponin negative x 2. Blood pressure at target he is on amlodipine  10 mg at home but without blood pressure medications he has been lower here in the hospital will send him home on amlodipine  5 mg daily.  With his negative coronary CTA he can be discharged to home.  Clarrisa Kaylor DO, MS [facility] Attending Cardiologist [facility]  [address] [place], [place] [address] [phone] Website: [email or web address]  "

## 2024-04-30 NOTE — ED Notes (Signed)
 Paper work reviewed with pt. Pt will be driving himself home. Pt is leaving in no new onset distress.

## 2024-04-30 NOTE — Discharge Summary (Signed)
 " Discharge Summary   Patient ID: Thomas Mcgee MRN: [id number]; DOB: 10-21-1970  Admit date: 04/29/2024 Discharge date: 04/30/2024  PCP:  Garald Karlynn GAILS, MD   [facility] Providers Cardiologist:  Georganna Archer, MD       Discharge Diagnoses  Principal Problem:   Chest pain   Diagnostic Studies/Procedures  Coronary CT on 04/30/2024 FINDINGS: Non-cardiac: See separate report from [facility].   Normal caliber aortic root and ascending aorta. Pulmonary veins drain normally to the left atrium. No LA appendage thrombus.   Calcium Score: 0 Agatston units.   Coronary Arteries: Right dominant with no anomalies   LM: No plaque or stenosis.   LAD system:  No plaque or stenosis.   Circumflex system: There is a moderate ramus present. No plaque or stenosis.   RCA system:  No plaque or stenosis.   IMPRESSION: 1. Coronary artery calcium score 0 Agatston units. This suggests low risk for future cardiac events.   2.  No significant coronary disease noted. _____________   History of Present Illness   Thomas Mcgee is a 54 y.o. male with a past medical history of hypertension, GERD, and recurrent chest pain.  In 2020 the patient had a stress echo that was negative.  The patient had previously had a TTE performed on 03/2024 that showed a normal LVEF, and G1 DD.  The patient was seen by Dr. Archer in the clinic yesterday.  The patient reported ongoing chest pain that had woken him up from his sleep and radiated to his arm and jaw.  The patient was unable to identify anything that made the pain better or worse.  The pain had lasted for 6 months.  He denied shortness of breath, PND, orthopnea, diaphoresis, nausea, or vomiting.  He reported infrequent alcohol use.  He denied nicotine use or illicit substance use.  An EKG was done and showed no acute ischemic changes.  The patient was sent to the hospital with recommendations to get a coronary CTA.  He was also  recommended to decrease his amlodipine  to 5 mg daily.   Hospital Course   Consultants: None  Chest pain Upon arrival to the emergency department high-sensitivity troponins were negative x 2.  The patient also reported that he was continuing to have intermittent episodes of ongoing chest pain.  Labs showed a creatinine of 1.36 and a normal hemoglobin of 14.7.  The following day the patient also reported he was continuing to have intermittent episodes of chest pain.  A coronary CTA was done today and showed a coronary calcium score of 0.  This suggest that the patient's chest pain is most likely noncardiac in nature. The Patient was seen by Dr Sheena and felt to be stable for discharge. Has outpatient follow-up scheduled with Dr. Archer on 06/06/24   Hypertension Received the patient was on amlodipine  10 mg daily. Amlodipine  was decreased to 5 mg daily by Dr. Archer because of concerns for hypotension.  In the hospital the patient has not received any pressure medications and blood pressure continues to remain well-controlled. Continue amlodipine  5 mg daily        Did the patient have an acute coronary syndrome (MI, NSTEMI, STEMI, etc) this admission?:  No                               Did the patient have a percutaneous coronary intervention (stent / angioplasty)?:  No.  _____________  Discharge Vitals Blood pressure 113/86, pulse 70, temperature 98.5 F (36.9 C), temperature source Oral, resp. rate 18, SpO2 100%.  There were no vitals filed for this visit.  Labs & Radiologic Studies  CBC Recent Labs    04/29/24 1220 04/30/24 0413  WBC 4.8 5.3  HGB 14.7 14.5  HCT 43.2 42.2  MCV 88.5 88.3  PLT 167 180   Basic Metabolic Panel Recent Labs    98/94/73 1220 04/29/24 1700 04/30/24 0413  NA 137  --  137  K 4.5  --  4.5  CL 101  --  101  CO2 26  --  25  GLUCOSE 108*  --  121*  BUN 13  --  14  CREATININE 1.36*  --  1.50*  CALCIUM 9.2  --  9.6  MG  --  1.7  --     Liver Function Tests No results for input(s): AST, ALT, ALKPHOS, BILITOT, PROT, ALBUMIN in the last 72 hours. No results for input(s): LIPASE, AMYLASE in the last 72 hours. High Sensitivity Troponin:   No results for input(s): TROPONINIHS in the last 720 hours.  Recent Labs  Lab 04/03/24 2001 04/29/24 1220 04/29/24 1447  TRNPT <15 <15 <15    BNP Invalid input(s): POCBNP No results for input(s): PROBNP in the last 72 hours.  No results for input(s): BNP in the last 72 hours.  D-Dimer No results for input(s): DDIMER in the last 72 hours. Hemoglobin A1C No results for input(s): HGBA1C in the last 72 hours. Fasting Lipid Panel No results for input(s): CHOL, HDL, LDLCALC, TRIG, CHOLHDL, LDLDIRECT in the last 72 hours. No results found for: LIPOA  Thyroid  Function Tests No results for input(s): TSH, T4TOTAL, T3FREE, THYROIDAB in the last 72 hours.  Invalid input(s): FREET3 _____________  CT CORONARY MORPH W/CTA COR W/SCORE W/CA W/CM &/OR WO/CM Addendum Date: 04/30/2024 ADDENDUM REPORT: 04/30/2024 12:51 EXAM: OVER-READ INTERPRETATION  CT CHEST The following report is an over-read performed by radiologist Dr. Toribio Rajas [facility] on 04/30/2024. This over-read does not include interpretation of cardiac or coronary anatomy or pathology. The cardiac CTA interpretation by the cardiologist is attached. COMPARISON:  Chest CT 03/26/2024 FINDINGS: Visualized pulmonary arterial system is unremarkable. Visualized thoracic aorta is normal. Remaining visualized mediastinal structures are normal. Visualized lungs are clear. Remaining bones and soft tissues are unremarkable. IMPRESSION: No acute findings. Electronically Signed   By: Toribio Agreste M.D.   On: 04/30/2024 12:51   Result Date: 04/30/2024 CLINICAL DATA:  Chest pain EXAM: Cardiac CTA MEDICATIONS: Sub lingual nitro. 4mg  x 2 TECHNIQUE: The patient was scanned on a Siemens 192  slice scanner. Gantry rotation speed was 250 msecs. Collimation was 0.6 mm. A 100 kV prospective scan was triggered in the ascending thoracic aorta at 35-75% of the R-R interval. Average HR during the scan was 60 bpm. The 3D data set was interpreted on a dedicated work station using MPR, MIP and VRT modes. A total of 80cc of contrast was used. FINDINGS: Non-cardiac: See separate report from [facility]. Normal caliber aortic root and ascending aorta. Pulmonary veins drain normally to the left atrium. No LA appendage thrombus. Calcium Score: 0 Agatston units. Coronary Arteries: Right dominant with no anomalies LM: No plaque or stenosis. LAD system:  No plaque or stenosis. Circumflex system: There is a moderate ramus present. No plaque or stenosis. RCA system:  No plaque or stenosis. IMPRESSION: 1. Coronary artery calcium score 0 Agatston units. This suggests low risk for  future cardiac events. 2.  No significant coronary disease noted. Thomas Mcgee Electronically Signed: By: Ezra Shuck M.D. On: 04/30/2024 12:30   DG Chest 2 View Result Date: 04/29/2024 CLINICAL DATA:  Left-sided chest pain radiating to left arm. EXAM: CHEST - 2 VIEW COMPARISON:  04/03/2024 FINDINGS: The heart size and mediastinal contours are within normal limits. Both lungs are clear. The visualized skeletal structures are unremarkable. IMPRESSION: No active cardiopulmonary disease. Electronically Signed   By: Norleen DELENA Kil M.D.   On: 04/29/2024 13:10   ECHOCARDIOGRAM COMPLETE Result Date: 04/17/2024    ECHOCARDIOGRAM REPORT   Patient Name:   Thomas Mcgee Date of Exam: 04/16/2024 Medical Rec #:  [id number]       Height:       65.0 in Accession #:    [id number]      Weight:       187.2 lb Date of Birth:  [birth year]        BSA:          1.923 m Patient Age:    53 years        BP:           115/92 mmHg Patient Gender: M               HR:           88 bpm. Exam Location:  [address] Procedure: 2D Echo, Cardiac Doppler, Color  Doppler and 3D Echo (Both Spectral            and Color Flow Doppler were utilized during procedure). Indications:    Chest Pain R07.9  History:        Patient has no prior history of Echocardiogram examinations.                 Risk Factors:Hypertension.  Sonographer:    Augustin Seals RDCS Referring Phys: [id number] KARLYNN GAILS PLOTNIKOV  Sonographer Comments: Global longitudinal strain was attempted. IMPRESSIONS  1. Left ventricular ejection fraction, by estimation, is 55 to 60%. Left ventricular ejection fraction by 3D volume is 61 %. The left ventricle has normal function. The left ventricle has no regional wall motion abnormalities. There is mild concentric left ventricular hypertrophy. Left ventricular diastolic parameters are consistent with Grade I diastolic dysfunction (impaired relaxation).  2. Right ventricular systolic function is normal. The right ventricular size is normal. There is normal pulmonary artery systolic pressure.  3. The mitral valve is normal in structure. Trivial mitral valve regurgitation. No evidence of mitral stenosis.  4. The aortic valve is tricuspid. There is mild calcification of the aortic valve. Aortic valve regurgitation is not visualized. Aortic valve sclerosis/calcification is present, without any evidence of aortic stenosis.  5. The inferior vena cava is normal in size with greater than 50% respiratory variability, suggesting right atrial pressure of 3 mmHg. FINDINGS  Left Ventricle: Left ventricular ejection fraction, by estimation, is 55 to 60%. Left ventricular ejection fraction by 3D volume is 61 %. The left ventricle has normal function. The left ventricle has no regional wall motion abnormalities. The left ventricular internal cavity size was normal in size. There is mild concentric left ventricular hypertrophy. Left ventricular diastolic parameters are consistent with Grade I diastolic dysfunction (impaired relaxation). Right Ventricle: The right ventricular size is  normal. No increase in right ventricular wall thickness. Right ventricular systolic function is normal. There is normal pulmonary artery systolic pressure. The tricuspid regurgitant velocity is 1.97 m/s, and  with an assumed right atrial pressure  of 3 mmHg, the estimated right ventricular systolic pressure is 18.5 mmHg. Left Atrium: Left atrial size was normal in size. Right Atrium: Right atrial size was normal in size. Pericardium: There is no evidence of pericardial effusion. Mitral Valve: The mitral valve is normal in structure. Trivial mitral valve regurgitation. No evidence of mitral valve stenosis. Tricuspid Valve: The tricuspid valve is normal in structure. Tricuspid valve regurgitation is mild . No evidence of tricuspid stenosis. Aortic Valve: The aortic valve is tricuspid. There is mild calcification of the aortic valve. Aortic valve regurgitation is not visualized. Aortic valve sclerosis/calcification is present, without any evidence of aortic stenosis. Pulmonic Valve: The pulmonic valve was normal in structure. Pulmonic valve regurgitation is trivial. No evidence of pulmonic stenosis. Aorta: The aortic root is normal in size and structure. Venous: The inferior vena cava is normal in size with greater than 50% respiratory variability, suggesting right atrial pressure of 3 mmHg. IAS/Shunts: No atrial level shunt detected by color flow Doppler. Additional Comments: 3D was performed not requiring image post processing on an independent workstation and was normal.  LEFT VENTRICLE PLAX 2D LVIDd:         3.50 cm         Diastology LVIDs:         2.20 cm         LV e' medial:    6.31 cm/s LV PW:         1.10 cm         LV E/e' medial:  8.0 LV IVS:        1.30 cm         LV e' lateral:   10.80 cm/s LVOT diam:     2.40 cm         LV E/e' lateral: 4.7 LV SV:         62 LV SV Index:   32 LVOT Area:     4.52 cm        3D Volume EF                                LV 3D EF:    Left                                              ventricul                                             ar                                             ejection                                             fraction  by 3D                                             volume is                                             61 %.                                 3D Volume EF:                                3D EF:        61 %                                LV EDV:       118 ml                                LV ESV:       45 ml                                LV SV:        72 ml RIGHT VENTRICLE RV Basal diam:  3.70 cm RV Mid diam:    3.20 cm RV S prime:     11.70 cm/s TAPSE (M-mode): 2.1 cm LEFT ATRIUM             Index        RIGHT ATRIUM           Index LA diam:        3.50 cm 1.82 cm/m   RA Area:     16.60 cm LA Vol (A2C):   17.5 ml 9.10 ml/m   RA Volume:   52.20 ml  27.14 ml/m LA Vol (A4C):   36.0 ml 18.72 ml/m LA Biplane Vol: 25.4 ml 13.21 ml/m  AORTIC VALVE LVOT Vmax:   79.00 cm/s LVOT Vmean:  52.000 cm/s LVOT VTI:    0.137 m  AORTA Ao Root diam: 3.50 cm Ao Asc diam:  3.30 cm MITRAL VALVE               TRICUSPID VALVE MV Area (PHT): 3.68 cm    TR Peak grad:   15.5 mmHg MV Decel Time: 206 msec    TR Vmax:        197.00 cm/s MV E velocity: 50.70 cm/s MV A velocity: 57.70 cm/s  SHUNTS MV E/A ratio:  0.88        Systemic VTI:  0.14 m                            Systemic Diam: 2.40 cm Toribio Fuel MD Electronically signed by Toribio Fuel MD Signature Date/Time: 04/17/2024/3:05:00 AM    Final    DG Thoracic Spine 2 View Result Date: 04/11/2024 CLINICAL DATA:  Left-sided chest pain EXAM: THORACIC  SPINE 2 VIEWS COMPARISON:  None Available. FINDINGS: There is no evidence of thoracic spine fracture. Alignment is normal. No other significant bone abnormalities are identified. IMPRESSION: Negative. Electronically Signed   By: Lynwood Landy Raddle M.D.   On: 04/11/2024 10:52   DG Cervical Spine Complete Result Date:  04/11/2024 CLINICAL DATA:  Chronic left neck and chest pain without injury EXAM: CERVICAL SPINE - COMPLETE 4+ VIEW COMPARISON:  None Available. FINDINGS: There is no evidence of cervical spine fracture or prevertebral soft tissue swelling. Alignment is normal. 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 IMPRESSION: Mild degenerative disc disease is noted at C3-4 with mild to moderate bilateral neural foraminal stenosis at this level secondary to uncovertebral spurring. No acute abnormality seen. Electronically Signed   By: Lynwood Landy Raddle M.D.   On: 04/11/2024 10:50   DG Chest 2 View Result Date: 04/03/2024 EXAM: 2 VIEW(S) XRAY OF THE CHEST 04/03/2024 08:14:00 PM COMPARISON: 07/25/2023 CLINICAL HISTORY: left chest pain FINDINGS: LUNGS AND PLEURA: No focal pulmonary opacity. No pleural effusion. No pneumothorax. HEART AND MEDIASTINUM: No acute abnormality of the cardiac and mediastinal silhouettes. BONES AND SOFT TISSUES: No acute osseous abnormality. IMPRESSION: 1. No acute cardiopulmonary process. Electronically signed by: Pinkie Pebbles MD 04/03/2024 08:17 PM EST RP Workstation: [id number]    Disposition Pt is being discharged home today in good condition.  Follow-up Plans & Appointments  Follow-up Information     Floretta Mallard, MD. Go to.   Specialty: Cardiology Why: Go to follow-up appointment with Dr. Floretta on 06/06/2024 at 8:40 AM.  Please arrive 15 minutes early. Contact information: [address] [place] [address] [phone]                  Discharge Medications Allergies as of 04/30/2024   No Known Allergies      Medication List     STOP taking these medications    indomethacin  50 MG capsule Commonly known as: INDOCIN    MAGNESIUM CITRATE PO   NERVIVE NERVE RELIEF PO   pantoprazole  40 MG tablet Commonly known as: PROTONIX     predniSONE  10 MG tablet Commonly known as: DELTASONE        TAKE these medications    amLODipine  5 MG tablet Commonly known as: NORVASC  Take 1 tablet (5 mg total) by mouth daily. What changed:  medication strength how much to take   NexIUM 24HR 20 MG capsule Generic drug: esomeprazole Take 20 mg by mouth daily as needed (hearburn, indigestion).         Outstanding Labs/Studies None  Duration of Discharge Encounter: APP Time: 10 minutes   Signed, Morse Clause, PA-C 04/30/2024, 1:33 PM        "

## 2024-05-01 ENCOUNTER — Telehealth: Payer: Self-pay

## 2024-05-01 ENCOUNTER — Other Ambulatory Visit: Payer: Self-pay | Admitting: Internal Medicine

## 2024-05-01 DIAGNOSIS — Z7185 Encounter for immunization safety counseling: Secondary | ICD-10-CM

## 2024-05-01 NOTE — Transitions of Care (Post Inpatient/ED Visit) (Signed)
" ° °  05/01/2024  Name: Labrandon Knoch MRN: [id number] DOB: 04/16/1971  Today's TOC FU Call Status: Today's TOC FU Call Status:: Unsuccessful Call (1st Attempt) Unsuccessful Call (1st Attempt) Date: 05/01/24  Attempted to reach the patient regarding the most recent Inpatient/ED visit.  Follow Up Plan: Additional outreach attempts will be made to reach the patient to complete the Transitions of Care (Post Inpatient/ED visit) call.   Signature Levon HERO, [place] "

## 2024-05-02 NOTE — Transitions of Care (Post Inpatient/ED Visit) (Signed)
" ° °  05/02/2024  Name: Thomas Mcgee MRN: [id number] DOB: 04-10-1971  Today's TOC FU Call Status: Today's TOC FU Call Status:: Successful TOC FU Call Completed Unsuccessful Call (1st Attempt) Date: 05/01/24 [facility] - Inpatient FU Call Complete Date: 05/02/24  Patient's Name and Date of Birth confirmed. Name, DOB  Transition Care Management Follow-up Telephone Call Date of Discharge: 04/29/24 Discharge Facility: [facility]) Type of Discharge: Inpatient Admission Primary Inpatient Discharge Diagnosis:: precordial pain How have you been since you were released from the hospital?: Same Any questions or concerns?: Yes Patient Questions/Concerns:: Still having pain  Items Reviewed: Did you receive and understand the discharge instructions provided?: Yes Any new allergies since your discharge?: No Dietary orders reviewed?: NA Do you have support at home?: Yes People in Home [RPT]: child(ren), adult, spouse  Medications Reviewed Today: Medications Reviewed Today     Reviewed by Will Arnette SAILOR, CMA (Certified Medical Assistant) on 05/02/24 at [phone]  Med List Status: <None>   Medication Order Taking? Sig Documenting Provider Last Dose Status Informant  amLODipine  (NORVASC ) 5 MG tablet [id number] Yes Take 1 tablet (5 mg total) by mouth daily. Adams, Zane, PA-C  Active   esomeprazole (NEXIUM 24HR) 20 MG capsule [id number]  Take 20 mg by mouth daily as needed (hearburn, indigestion).  Patient not taking: Reported on 05/02/2024   [provider]  Active Self, Pharmacy Records            Home Care and Equipment/Supplies: Were Home Health Services Ordered?: NA Any new equipment or medical supplies ordered?: NA  Functional Questionnaire: Do you need assistance with bathing/showering or dressing?: No Do you need assistance with meal preparation?: No Do you need assistance with eating?: No Do you have difficulty maintaining continence: No Do you need assistance with getting out of  bed/getting out of a chair/moving?: No Do you have difficulty managing or taking your medications?: No  Follow up appointments reviewed: PCP Follow-up appointment confirmed?: Yes Date of PCP follow-up appointment?: 05/09/24 Follow-up Provider: [facility] Follow-up appointment confirmed?: NA Do you need transportation to your follow-up appointment?: No Do you understand care options if your condition(s) worsen?: Yes-patient verbalized understanding    CELESTINO Carrier M,CMA "

## 2024-05-03 ENCOUNTER — Ambulatory Visit: Admitting: Cardiology

## 2024-05-05 ENCOUNTER — Encounter: Payer: Self-pay | Admitting: Internal Medicine

## 2024-05-08 ENCOUNTER — Ambulatory Visit: Admitting: Cardiology

## 2024-05-09 ENCOUNTER — Inpatient Hospital Stay: Admitting: Internal Medicine

## 2024-05-09 ENCOUNTER — Ambulatory Visit: Admitting: Internal Medicine

## 2024-05-09 ENCOUNTER — Encounter: Payer: Self-pay | Admitting: Internal Medicine

## 2024-05-09 VITALS — BP 108/54 | HR 73 | Ht 65.0 in | Wt 178.2 lb

## 2024-05-09 DIAGNOSIS — R0789 Other chest pain: Secondary | ICD-10-CM

## 2024-05-09 DIAGNOSIS — M542 Cervicalgia: Secondary | ICD-10-CM | POA: Diagnosis not present

## 2024-05-09 DIAGNOSIS — R7309 Other abnormal glucose: Secondary | ICD-10-CM | POA: Diagnosis not present

## 2024-05-09 DIAGNOSIS — M25512 Pain in left shoulder: Secondary | ICD-10-CM

## 2024-05-09 DIAGNOSIS — I10 Essential (primary) hypertension: Secondary | ICD-10-CM | POA: Diagnosis not present

## 2024-05-09 DIAGNOSIS — G8929 Other chronic pain: Secondary | ICD-10-CM | POA: Diagnosis not present

## 2024-05-09 NOTE — Progress Notes (Signed)
 "  Subjective:  Patient ID: Thomas Mcgee, male    DOB: 1970-07-27  Age: 54 y.o. MRN: [id number]  CC: Hospitalization Follow-up [facility] follow up 01/05-01/06 for precordial pain, discuss new observations)   HPI Thomas Mcgee presents for L chest/shoulder pain, now pain is located more in the neck and the L upper arm area.  The pain has been severe, random, off and on, unbearable at times.  No triggers.  Steroids helped in the past... Cardiac and pulmonary etiology was ruled out.   Outpatient Medications Prior to Visit  Medication Sig Dispense Refill   amLODipine  (NORVASC ) 5 MG tablet Take 1 tablet (5 mg total) by mouth daily. 30 tablet 11   esomeprazole (NEXIUM 24HR) 20 MG capsule Take 20 mg by mouth daily as needed (hearburn, indigestion). (Patient not taking: Reported on 05/09/2024)     No facility-administered medications prior to visit.    ROS: Review of Systems  Constitutional:  Negative for appetite change, fatigue and unexpected weight change.  HENT:  Negative for congestion, nosebleeds, sneezing, sore throat and trouble swallowing.   Eyes:  Negative for itching and visual disturbance.  Respiratory:  Negative for cough and shortness of breath.   Cardiovascular:  Positive for chest pain. Negative for palpitations and leg swelling.  Gastrointestinal:  Negative for abdominal distention, blood in stool, diarrhea and nausea.  Genitourinary:  Negative for frequency and hematuria.  Musculoskeletal:  Positive for arthralgias. Negative for back pain, gait problem, joint swelling and neck pain.  Skin:  Negative for rash.  Neurological:  Negative for dizziness, tremors, speech difficulty and weakness.  Psychiatric/Behavioral:  Negative for agitation, dysphoric mood and sleep disturbance. The patient is not nervous/anxious.     Objective:  BP (!) 108/54   Pulse 73   Ht 5' 5 (1.651 m)   Wt 178 lb 3.2 oz (80.8 kg)   SpO2 96%   BMI 29.65 kg/m   BP Readings from Last 3 Encounters:   05/09/24 (!) 108/54  04/30/24 (!) 116/92  04/29/24 (!) 88/52    Wt Readings from Last 3 Encounters:  05/09/24 178 lb 3.2 oz (80.8 kg)  04/29/24 177 lb 3.2 oz (80.4 kg)  04/16/24 178 lb (80.7 kg)    Physical Exam Constitutional:      General: He is not in acute distress.    Appearance: He is well-developed.     Comments: NAD  Eyes:     Conjunctiva/sclera: Conjunctivae normal.     Pupils: Pupils are equal, round, and reactive to light.  Neck:     Thyroid : No thyromegaly.     Vascular: No JVD.  Cardiovascular:     Rate and Rhythm: Normal rate and regular rhythm.     Heart sounds: Normal heart sounds. No murmur heard.    No friction rub. No gallop.  Pulmonary:     Effort: Pulmonary effort is normal. No respiratory distress.     Breath sounds: Normal breath sounds. No wheezing or rales.  Chest:     Chest wall: No tenderness.  Abdominal:     General: Bowel sounds are normal. There is no distension.     Palpations: Abdomen is soft. There is no mass.     Tenderness: There is no abdominal tenderness. There is no guarding or rebound.  Musculoskeletal:        General: Tenderness present. Normal range of motion.     Cervical back: Normal range of motion.     Right lower leg: No edema.  Left lower leg: No edema.  Lymphadenopathy:     Cervical: No cervical adenopathy.  Skin:    General: Skin is warm and dry.     Findings: No rash.  Neurological:     Mental Status: He is alert and oriented to person, place, and time.     Cranial Nerves: No cranial nerve deficit.     Motor: No abnormal muscle tone.     Coordination: Coordination normal.     Gait: Gait normal.     Deep Tendon Reflexes: Reflexes are normal and symmetric.  Psychiatric:        Behavior: Behavior normal.        Thought Content: Thought content normal.        Judgment: Judgment normal.   Neck is nontender to palpation and range of motion.  Left shoulder is nontender to palpation and range of motion.  Chest  wall is nontender Muscle strength, deep tendon reflexes within normal limits  Lab Results  Component Value Date   WBC 5.3 04/30/2024   HGB 14.5 04/30/2024   HCT 42.2 04/30/2024   PLT 180 04/30/2024   GLUCOSE 121 (H) 04/30/2024   CHOL 166 01/18/2024   TRIG 77.0 01/18/2024   HDL 34.50 (L) 01/18/2024   LDLCALC 116 (H) 01/18/2024   ALT 13 03/26/2024   AST 22 03/26/2024   NA 137 04/30/2024   K 4.5 04/30/2024   CL 101 04/30/2024   CREATININE 1.50 (H) 04/30/2024   BUN 14 04/30/2024   CO2 25 04/30/2024   TSH 4.44 01/18/2024   PSA 1.99 01/18/2024   HGBA1C 5.7 12/08/2022    CT CORONARY MORPH W/CTA COR W/SCORE W/CA W/CM &/OR WO/CM Addendum Date: 04/30/2024 ADDENDUM REPORT: 04/30/2024 12:51 EXAM: OVER-READ INTERPRETATION  CT CHEST The following report is an over-read performed by radiologist Dr. Toribio Rajas [facility] on 04/30/2024. This over-read does not include interpretation of cardiac or coronary anatomy or pathology. The cardiac CTA interpretation by the cardiologist is attached. COMPARISON:  Chest CT 03/26/2024 FINDINGS: Visualized pulmonary arterial system is unremarkable. Visualized thoracic aorta is normal. Remaining visualized mediastinal structures are normal. Visualized lungs are clear. Remaining bones and soft tissues are unremarkable. IMPRESSION: No acute findings. Electronically Signed   By: Toribio Agreste M.D.   On: 04/30/2024 12:51   Result Date: 04/30/2024 CLINICAL DATA:  Chest pain EXAM: Cardiac CTA MEDICATIONS: Sub lingual nitro. 4mg  x 2 TECHNIQUE: The patient was scanned on a Siemens 192 slice scanner. Gantry rotation speed was 250 msecs. Collimation was 0.6 mm. A 100 kV prospective scan was triggered in the ascending thoracic aorta at 35-75% of the R-R interval. Average HR during the scan was 60 bpm. The 3D data set was interpreted on a dedicated work station using MPR, MIP and VRT modes. A total of 80cc of contrast was used. FINDINGS: Non-cardiac: See separate  report from [facility]. Normal caliber aortic root and ascending aorta. Pulmonary veins drain normally to the left atrium. No LA appendage thrombus. Calcium Score: 0 Agatston units. Coronary Arteries: Right dominant with no anomalies LM: No plaque or stenosis. LAD system:  No plaque or stenosis. Circumflex system: There is a moderate ramus present. No plaque or stenosis. RCA system:  No plaque or stenosis. IMPRESSION: 1. Coronary artery calcium score 0 Agatston units. This suggests low risk for future cardiac events. 2.  No significant coronary disease noted. [employer] Electronically Signed: By: Ezra Shuck M.D. On: 04/30/2024 12:30   DG Chest 2 View Result  Date: 04/29/2024 CLINICAL DATA:  Left-sided chest pain radiating to left arm. EXAM: CHEST - 2 VIEW COMPARISON:  04/03/2024 FINDINGS: The heart size and mediastinal contours are within normal limits. Both lungs are clear. The visualized skeletal structures are unremarkable. IMPRESSION: No active cardiopulmonary disease. Electronically Signed   By: Norleen DELENA Kil M.D.   On: 04/29/2024 13:10    Assessment & Plan:   Problem List Items Addressed This Visit     HTN (hypertension)   Take amlodipine  5 mg daily      Neck pain - Primary   Severe, recurrent L chest/shoulder pain, now pain is located more in the neck and the L upper arm area.  The pain has been severe, random, off and on, unbearable at times.  No triggers.  Steroids helped in the past... Cardiac and pulmonary etiology was ruled out.   R/o cervical radiculopathy - ordered C spine MRI scan      Relevant Orders   MR Cervical Spine Wo Contrast   Chest pain, atypical   Severe, recurrent L chest/shoulder pain, now pain is located more in the neck and the L upper arm area.  The pain has been severe, random, off and on, unbearable at times.  No triggers.  Steroids helped in the past... Cardiac and pulmonary etiology was ruled out.   R/o cervical radiculopathy - ordered C spine  MRI scan      Relevant Orders   MR Cervical Spine Wo Contrast   Shoulder pain, left   Severe, recurrent L chest/shoulder pain, now pain is located more in the neck and the L upper arm area.  The pain has been severe, random, off and on, unbearable at times.  No triggers.  Steroids helped in the past... Cardiac and pulmonary etiology was ruled out.   R/o cervical radiculopathy - ordered C spine MRI scan      Relevant Orders   MR Cervical Spine Wo Contrast   Other Visit Diagnoses       Elevated glucose       Relevant Orders   Comprehensive metabolic panel with GFR   Hemoglobin A1c         No orders of the defined types were placed in this encounter.     Follow-up: Return in about 6 weeks (around 06/20/2024).  Marolyn Noel, MD "

## 2024-05-09 NOTE — Assessment & Plan Note (Addendum)
 Severe, recurrent L chest/shoulder pain, now pain is located more in the neck and the L upper arm area.  The pain has been severe, random, off and on, unbearable at times.  No triggers.  Steroids helped in the past... Cardiac and pulmonary etiology was ruled out.   R/o cervical radiculopathy - ordered C spine MRI scan

## 2024-05-09 NOTE — Assessment & Plan Note (Signed)
Take amlodipine 5mg daily

## 2024-05-10 ENCOUNTER — Ambulatory Visit
Admission: RE | Admit: 2024-05-10 | Discharge: 2024-05-10 | Disposition: A | Discharge status: Home / Self Care | Source: Ambulatory Visit | Attending: Internal Medicine | Admitting: Internal Medicine

## 2024-05-10 ENCOUNTER — Other Ambulatory Visit

## 2024-05-10 DIAGNOSIS — R0789 Other chest pain: Secondary | ICD-10-CM

## 2024-05-10 DIAGNOSIS — M542 Cervicalgia: Secondary | ICD-10-CM

## 2024-05-10 DIAGNOSIS — G8929 Other chronic pain: Secondary | ICD-10-CM

## 2024-05-11 ENCOUNTER — Other Ambulatory Visit

## 2024-05-12 ENCOUNTER — Other Ambulatory Visit: Payer: Self-pay | Admitting: Internal Medicine

## 2024-05-12 ENCOUNTER — Ambulatory Visit: Payer: Self-pay | Admitting: Internal Medicine

## 2024-05-12 DIAGNOSIS — R0789 Other chest pain: Secondary | ICD-10-CM

## 2024-05-12 DIAGNOSIS — M25512 Pain in left shoulder: Secondary | ICD-10-CM

## 2024-05-12 DIAGNOSIS — M542 Cervicalgia: Secondary | ICD-10-CM

## 2024-05-15 ENCOUNTER — Other Ambulatory Visit

## 2024-05-15 DIAGNOSIS — R7309 Other abnormal glucose: Secondary | ICD-10-CM

## 2024-05-15 DIAGNOSIS — Z7185 Encounter for immunization safety counseling: Secondary | ICD-10-CM

## 2024-05-15 LAB — COMPREHENSIVE METABOLIC PANEL WITH GFR
ALT: 12 U/L (ref 3–53)
AST: 21 U/L (ref 5–37)
Albumin: 4.2 g/dL (ref 3.5–5.2)
Alkaline Phosphatase: 49 U/L (ref 39–117)
BUN: 10 mg/dL (ref 6–23)
CO2: 29 meq/L (ref 19–32)
Calcium: 9 mg/dL (ref 8.4–10.5)
Chloride: 102 meq/L (ref 96–112)
Creatinine, Ser: 1.36 mg/dL (ref 0.40–1.50)
GFR: 59.67 mL/min — ABNORMAL LOW
Glucose, Bld: 132 mg/dL — ABNORMAL HIGH (ref 70–99)
Potassium: 3.8 meq/L (ref 3.5–5.1)
Sodium: 137 meq/L (ref 135–145)
Total Bilirubin: 0.5 mg/dL (ref 0.2–1.2)
Total Protein: 6.9 g/dL (ref 6.0–8.3)

## 2024-05-15 LAB — HEMOGLOBIN A1C: Hgb A1c MFr Bld: 6.2 % (ref 4.6–6.5)

## 2024-05-16 LAB — HEPATITIS B SURFACE ANTIGEN: Hepatitis B Surface Ag: NONREACTIVE

## 2024-05-16 LAB — HEPATITIS B SURFACE ANTIBODY, QUANTITATIVE: Hep B S AB Quant (Post): 17 m[IU]/mL

## 2024-05-20 ENCOUNTER — Inpatient Hospital Stay: Admitting: Internal Medicine

## 2024-06-06 ENCOUNTER — Ambulatory Visit: Admitting: Student in an Organized Health Care Education/Training Program

## 2024-06-18 ENCOUNTER — Ambulatory Visit: Admitting: Orthopedic Surgery
